# Patient Record
Sex: Female | Born: 1950 | Race: White | Hispanic: No | Marital: Single | Smoking: Former smoker
Health system: Southern US, Community
[De-identification: ages and names within clinical notes are randomized; demographics above are authoritative.]

## PROBLEM LIST (undated history)

## (undated) DIAGNOSIS — E039 Hypothyroidism, unspecified: Secondary | ICD-10-CM

## (undated) DIAGNOSIS — K219 Gastro-esophageal reflux disease without esophagitis: Secondary | ICD-10-CM

## (undated) DIAGNOSIS — K5792 Diverticulitis of intestine, part unspecified, without perforation or abscess without bleeding: Secondary | ICD-10-CM

## (undated) HISTORY — DX: Diverticulitis of intestine, part unspecified, without perforation or abscess without bleeding: K57.92

## (undated) HISTORY — DX: Gastro-esophageal reflux disease without esophagitis: K21.9

---

## 2009-02-01 ENCOUNTER — Inpatient Hospital Stay: Payer: Self-pay | Admitting: Internal Medicine

## 2009-04-04 ENCOUNTER — Ambulatory Visit: Payer: Self-pay | Admitting: Gastroenterology

## 2009-07-14 ENCOUNTER — Ambulatory Visit: Payer: Self-pay | Admitting: Surgery

## 2009-07-30 ENCOUNTER — Ambulatory Visit: Payer: Self-pay | Admitting: Surgery

## 2009-07-31 ENCOUNTER — Ambulatory Visit: Payer: Self-pay | Admitting: Surgery

## 2009-08-07 ENCOUNTER — Inpatient Hospital Stay: Payer: Self-pay | Admitting: Surgery

## 2009-09-15 ENCOUNTER — Ambulatory Visit: Payer: Self-pay | Admitting: Internal Medicine

## 2009-10-24 ENCOUNTER — Ambulatory Visit: Payer: Self-pay | Admitting: Internal Medicine

## 2010-10-26 ENCOUNTER — Ambulatory Visit: Payer: Self-pay | Admitting: Internal Medicine

## 2011-06-29 ENCOUNTER — Inpatient Hospital Stay: Payer: Self-pay | Admitting: Internal Medicine

## 2011-09-17 IMAGING — US ABDOMEN ULTRASOUND
1 series · 17 of 25 positions shown · non-contrast
Comparison: none

REASON FOR EXAM: abd pain   eval liver and gallbladder  status post
sigmoid colectomy
COMMENTS:

[Series 1: abdomen ultrasound · 17 of 54 slices shown]
[im 1/54]
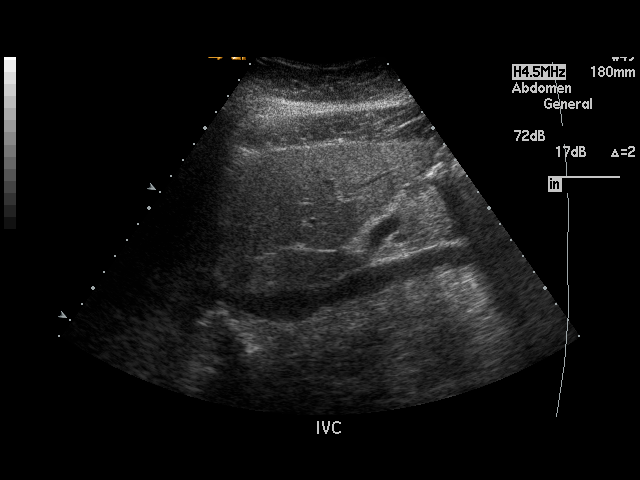
[im 5/54]
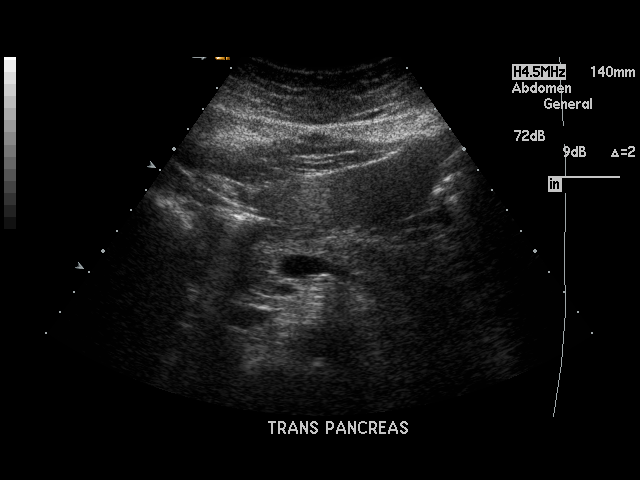
[im 7/54]
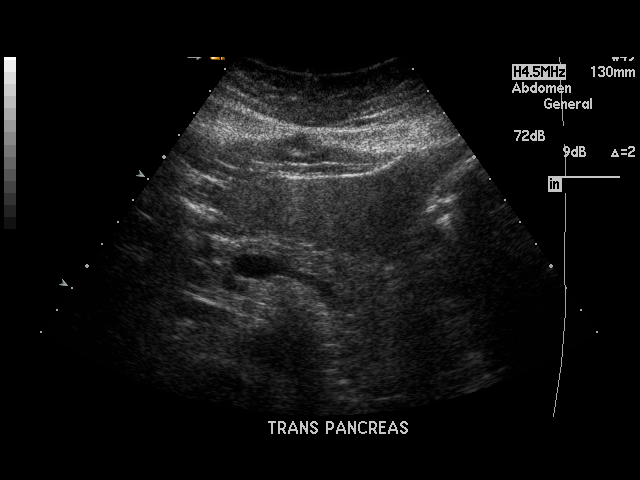
[im 12/54]
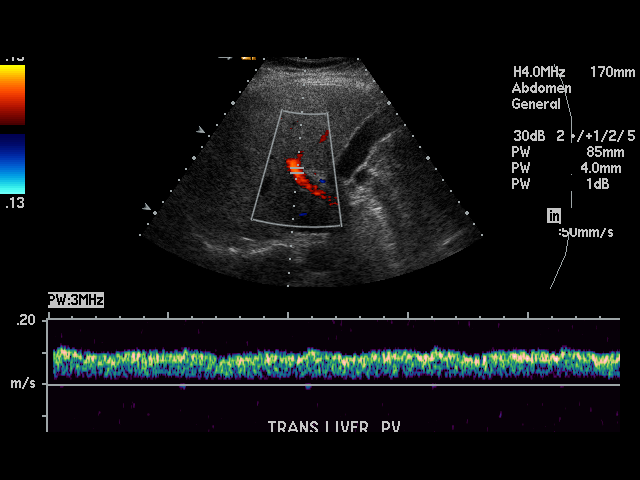
[im 14/54]
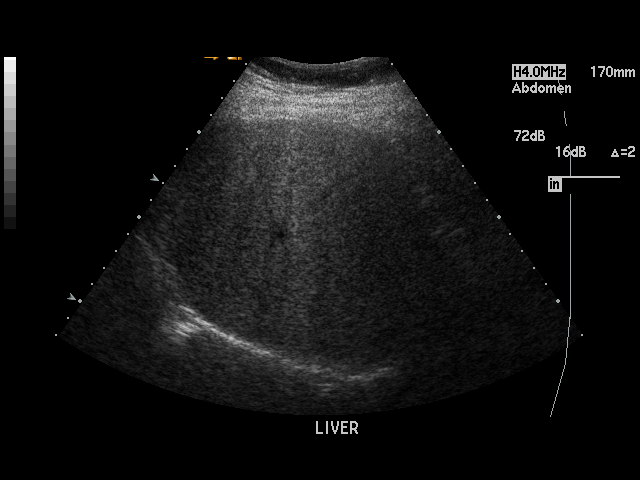
[im 18/54]
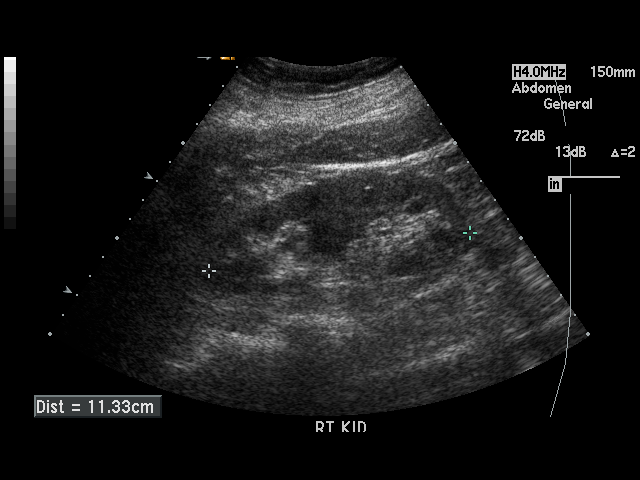
[im 20/54]
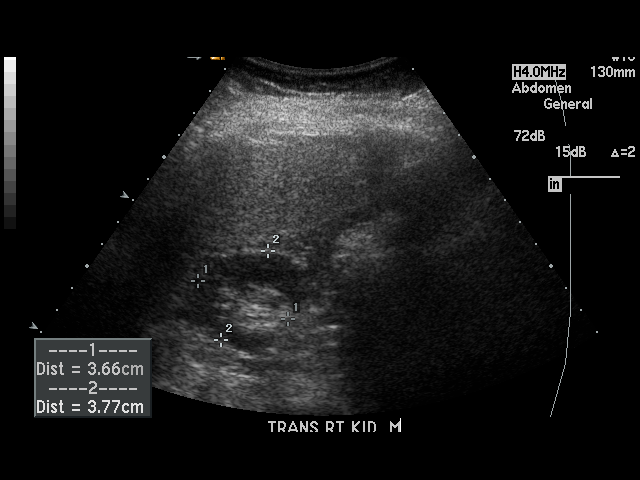
[im 25/54]
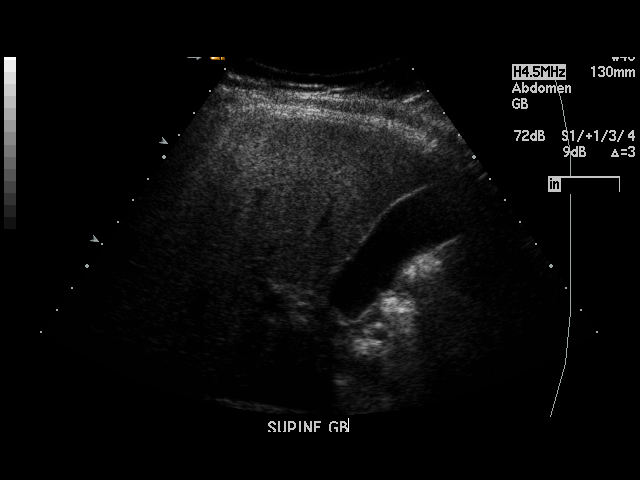
[im 27/54]
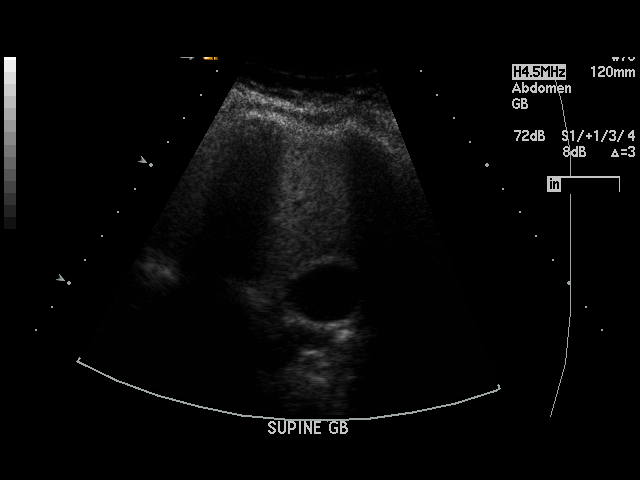
[im 29/54]
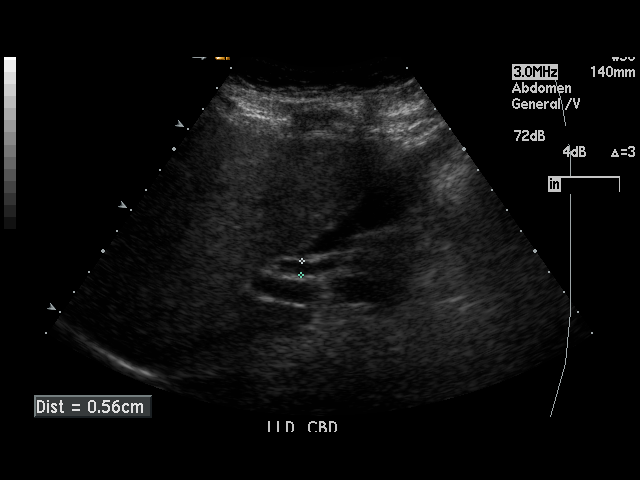
[im 34/54]
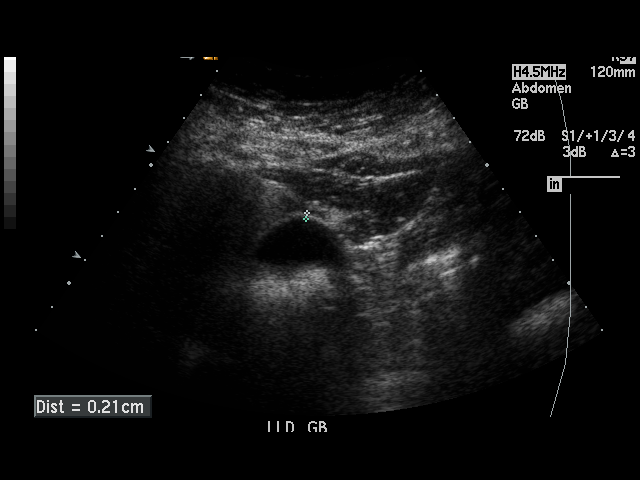
[im 36/54]
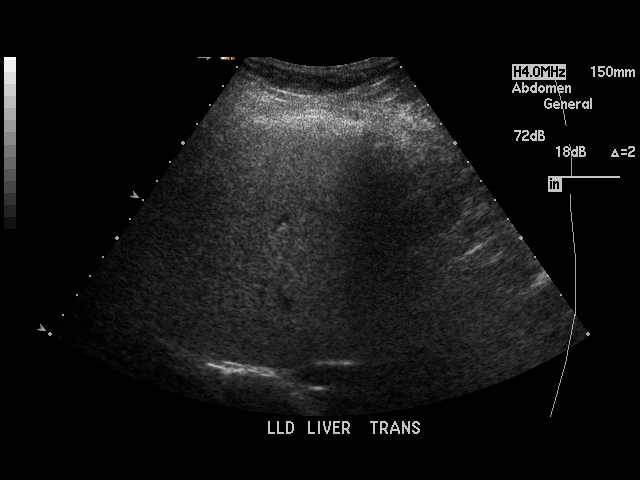
[im 40/54]
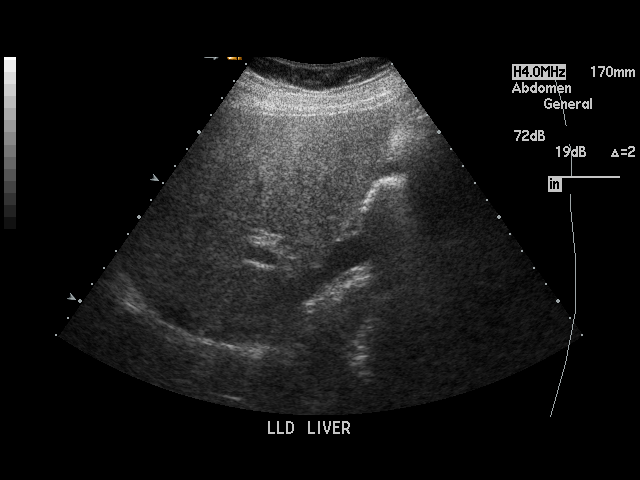
[im 42/54]
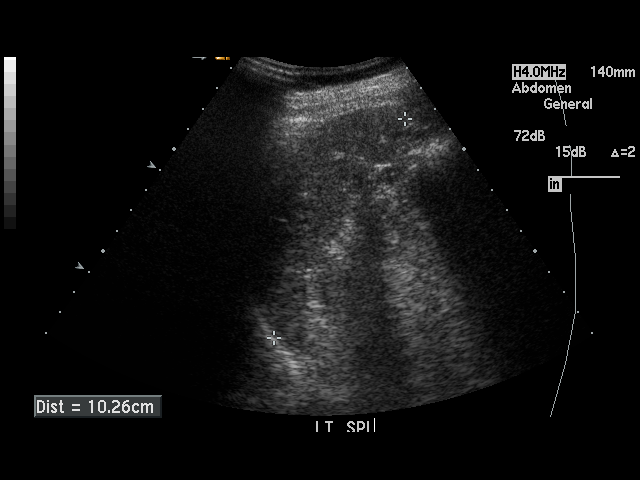
[im 47/54]
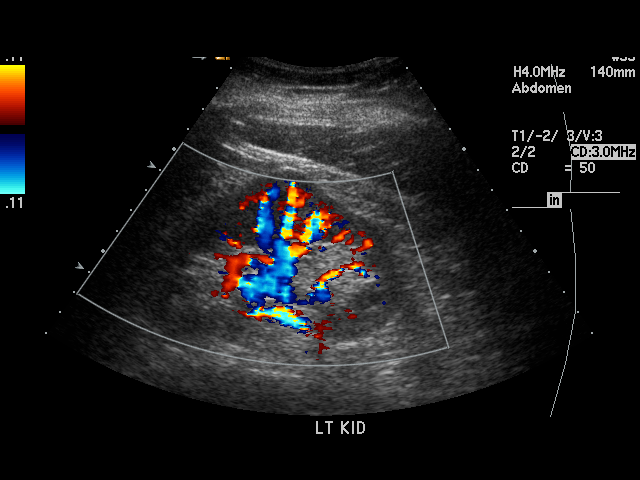
[im 49/54]
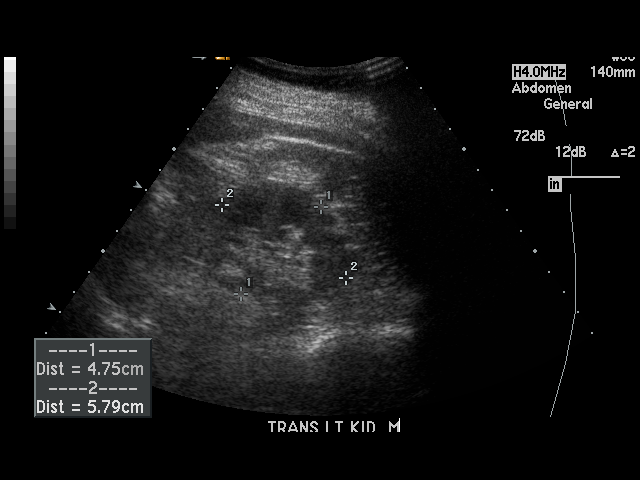
[im 54/54]
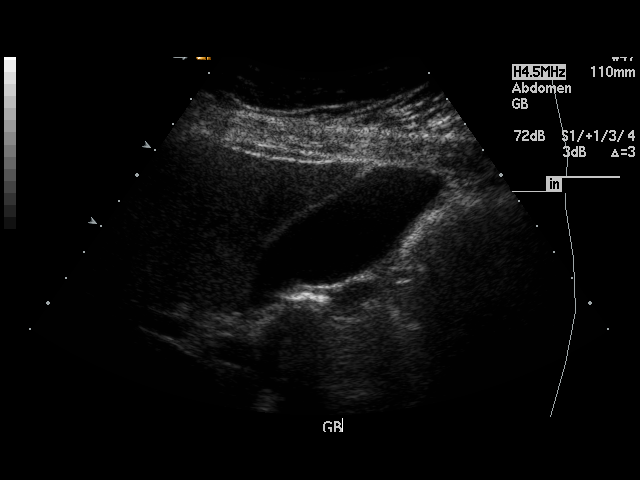

[17 of 25 positions shown; findings below may reference images not displayed]

PROCEDURE:     US  - US ABDOMEN GENERAL SURVEY  - September 15, 2009 [DATE]

RESULT:     The liver, spleen, pancreas, abdominal aorta and inferior vena
cava show no significant abnormalities.  No gallstones are seen. There is no
thickening of the gallbladder wall. The common bile duct measures 5.6 cm at
maximum diameter which is within normal limits. No renal mass lesions are
seen. There is mild prominence of the right renal pelvis, likely secondary
to an extrarenal pelvis. No dilatation of the renal calyces is seen.
IMPRESSION: 1. No acute changes are identified.
2. There is mild prominence of the right renal pelvis without associated
calyceal dilatation and likely secondary to an extrarenal pelvis.

## 2011-11-18 ENCOUNTER — Ambulatory Visit: Payer: Self-pay | Admitting: Internal Medicine

## 2018-06-29 ENCOUNTER — Other Ambulatory Visit: Payer: Self-pay

## 2018-06-29 ENCOUNTER — Emergency Department: Payer: Medicare Other

## 2018-06-29 ENCOUNTER — Emergency Department
Admission: EM | Admit: 2018-06-29 | Discharge: 2018-06-29 | Disposition: A | Discharge status: Home / Self Care | Payer: Medicare Other | Attending: Emergency Medicine | Admitting: Emergency Medicine

## 2018-06-29 DIAGNOSIS — Y93K1 Activity, walking an animal: Secondary | ICD-10-CM | POA: Insufficient documentation

## 2018-06-29 DIAGNOSIS — W010XXA Fall on same level from slipping, tripping and stumbling without subsequent striking against object, initial encounter: Secondary | ICD-10-CM | POA: Diagnosis not present

## 2018-06-29 DIAGNOSIS — W19XXXA Unspecified fall, initial encounter: Secondary | ICD-10-CM

## 2018-06-29 DIAGNOSIS — Y929 Unspecified place or not applicable: Secondary | ICD-10-CM | POA: Diagnosis not present

## 2018-06-29 DIAGNOSIS — S63511A Sprain of carpal joint of right wrist, initial encounter: Secondary | ICD-10-CM

## 2018-06-29 DIAGNOSIS — Y999 Unspecified external cause status: Secondary | ICD-10-CM | POA: Insufficient documentation

## 2018-06-29 DIAGNOSIS — Z79899 Other long term (current) drug therapy: Secondary | ICD-10-CM | POA: Insufficient documentation

## 2018-06-29 DIAGNOSIS — Y92009 Unspecified place in unspecified non-institutional (private) residence as the place of occurrence of the external cause: Secondary | ICD-10-CM

## 2018-06-29 DIAGNOSIS — S6991XA Unspecified injury of right wrist, hand and finger(s), initial encounter: Secondary | ICD-10-CM | POA: Diagnosis present

## 2018-06-29 MED ORDER — TRAMADOL HCL 50 MG PO TABS: 50.0000 mg | ORAL_TABLET | Freq: Two times a day (BID) | ORAL | 0 refills | Status: DC | PRN

## 2018-06-29 MED ORDER — TRAMADOL HCL 50 MG PO TABS
50.0000 mg | ORAL_TABLET | Freq: Once | ORAL | Status: AC
  Administered 2018-06-29: 50 mg via ORAL
  Filled 2018-06-29: qty 1

## 2018-06-29 MED ORDER — IBUPROFEN 400 MG PO TABS
400.0000 mg | ORAL_TABLET | Freq: Once | ORAL | Status: AC
  Administered 2018-06-29: 400 mg via ORAL
  Filled 2018-06-29: qty 1

## 2018-06-29 NOTE — ED Provider Notes (Signed)
[facility] Emergency Department Provider Note   ____________________________________________   First MD Initiated Contact with Patient 06/29/18 1052     (approximate)  I have reviewed the triage vital signs and the nursing notes.   HISTORY  Chief Complaint Fall    HPI Stacy Schmidt is a 67 y.o. female patient complain of right wrist pain secondary to trip and fall today while at her dog outside.  Patient broke a fall landing on the right wrist.  Patient states this pain edema but she noticed no deformity.  Patient denies loss of sensation or loss of function.  Patient did pain increased with dorsal flexion.  Patient rates pain as 8/10.  Patient described the pain is "achy".  No palliative measures prior to arrival.  No past medical history on file.  There are no active problems to display for this patient.     Prior to Admission medications   Medication Sig Start Date End Date Taking? Authorizing Provider  levothyroxine (SYNTHROID, LEVOTHROID) 112 MCG tablet Take 112 mcg by mouth daily before breakfast.   Yes [provider]  traMADol (ULTRAM) 50 MG tablet Take 1 tablet (50 mg total) by mouth every 12 (twelve) hours as needed. 06/29/18   Joni ReiningSmith, Ronald K, PA-C    Allergies Patient has no known allergies.  No family history on file.  Social History Social History   Tobacco Use  . Smoking status: Not on file  Substance Use Topics  . Alcohol use: Not on file  . Drug use: Not on file    Review of Systems Constitutional: No fever/chills Eyes: No visual changes. ENT: No sore throat. Cardiovascular: Denies chest pain. Respiratory: Denies shortness of breath. Gastrointestinal: No abdominal pain.  No nausea, no vomiting.  No diarrhea.  No constipation. Genitourinary: Negative for dysuria. Musculoskeletal: Negative for back pain. Skin: Negative for rash. Neurological: Negative for headaches, focal weakness or  numbness. Endocrine:Hypothyroidism  ____________________________________________   PHYSICAL EXAM:  VITAL SIGNS: ED Triage Vitals  Enc Vitals Group     BP 06/29/18 0933 (!) 116/91     Pulse Rate 06/29/18 0933 89     Resp 06/29/18 0933 18     Temp 06/29/18 0933 97.9 F (36.6 C)     Temp Source 06/29/18 0933 Oral     SpO2 06/29/18 0933 96 %     Weight --      Height --      Head Circumference --      Peak Flow --      Pain Score 06/29/18 0934 8     Pain Loc --      Pain Edu? --      Excl. in GC? --    Constitutional: Alert and oriented. Well appearing and in no acute distress. Cardiovascular: Normal rate, regular rhythm. Grossly normal heart sounds.  Good peripheral circulation. Respiratory: Normal respiratory effort.  No retractions. Lungs CTAB. Musculoskeletal: Obvious deformity to right wrist.  There is mild edema.  Patient has moderate guarding palpation at the scaphoid.  Decreased range of motion with dorsiflexion limited by complaint of pain. Neurologic:  Normal speech and language. No gross focal neurologic deficits are appreciated. No gait instability. Skin:  Skin is warm, dry and intact. No rash noted. Psychiatric: Mood and affect are normal. Speech and behavior are normal.  ____________________________________________   LABS (all labs ordered are listed, but only abnormal results are displayed)  Labs Reviewed - No data to display ____________________________________________  EKG  ____________________________________________  RADIOLOGY  ED MD interpretation:    Official radiology report(s): Dg Wrist Complete Right  Result Date: 06/29/2018 CLINICAL DATA:  Pt fell out the door while trying to let senior dog outside, pt states she fell forward onto right wrist; pain anterior right wrist more proximal EXAM: RIGHT WRIST - COMPLETE 3+ VIEW COMPARISON:  None. FINDINGS: No fracture.  No bone lesion. Scapholunate interval measures 3 mm, raising the possibility of  a scapholunate ligament disruption. Mild narrowing of the scaphoid trapezium articulation. Remaining joints normally spaced and aligned. Soft tissues are unremarkable. IMPRESSION: 1. No acute fracture or dislocation. 2. Questionable disruption of the scapholunate ligament reflected by borderline widening of the scapholunate interval, likely a chronic finding. 3. Mild scaphoid trapezium articulation osteoarthritis. Electronically Signed   By: Amie Portland M.D.   On: 06/29/2018 10:25    ____________________________________________   PROCEDURES  Procedure(s) performed: None  Procedures  Critical Care performed: No  ____________________________________________   INITIAL IMPRESSION / ASSESSMENT AND PLAN / ED COURSE  As part of my medical decision making, I reviewed the following data within the electronic MEDICAL RECORD NUMBER    Sprain right wrist.  Discussed x-ray findings with patient.  Patient placed in a wrist splint and advised follow-up with orthopedics.     ____________________________________________   FINAL CLINICAL IMPRESSION(S) / ED DIAGNOSES  Final diagnoses:  Sprain of carpal joint of right wrist, initial encounter  Fall in home, initial encounter     ED Discharge Orders         Ordered    traMADol (ULTRAM) 50 MG tablet  Every 12 hours PRN     06/29/18 1103           Note:  This document was prepared using Dragon voice recognition software and may include unintentional dictation errors.    Joni Reining, PA-C 06/29/18 1109    Arnaldo Natal, MD 07/01/18 [phone]

## 2018-06-29 NOTE — ED Triage Notes (Signed)
Pt fell out the door while trying to let senior dog outside, pt states she fell forward onto right wrist, pt also hit head on railing, but denies pain to head.

## 2018-06-29 NOTE — Discharge Instructions (Addendum)
Wear splint while awake for 5 to 7 days as needed.  Advised ibuprofen 400 mg every 8 hours as needed for swelling.

## 2018-06-29 NOTE — ED Notes (Signed)
See triage note  Presents s/p fall  States she fell out of her front door  Hit her head,right side of chest and wrist    Denies any head pain at present but is having pain to wrist and chest  Good pulses noted

## 2018-06-29 NOTE — ED Notes (Signed)
FIRST NURSE NOTE:  Pt had fall last night into wooden railing, fell onto right wrist, pain and swelling noted.

## 2020-06-04 ENCOUNTER — Other Ambulatory Visit: Payer: Self-pay

## 2020-06-04 DIAGNOSIS — N179 Acute kidney failure, unspecified: Principal | ICD-10-CM | POA: Diagnosis present

## 2020-06-04 DIAGNOSIS — E872 Acidosis: Secondary | ICD-10-CM | POA: Diagnosis present

## 2020-06-04 DIAGNOSIS — F17210 Nicotine dependence, cigarettes, uncomplicated: Secondary | ICD-10-CM | POA: Diagnosis present

## 2020-06-04 DIAGNOSIS — E860 Dehydration: Secondary | ICD-10-CM | POA: Diagnosis present

## 2020-06-04 DIAGNOSIS — E039 Hypothyroidism, unspecified: Secondary | ICD-10-CM | POA: Diagnosis present

## 2020-06-04 DIAGNOSIS — Z20822 Contact with and (suspected) exposure to covid-19: Secondary | ICD-10-CM | POA: Diagnosis present

## 2020-06-04 DIAGNOSIS — R55 Syncope and collapse: Secondary | ICD-10-CM | POA: Diagnosis not present

## 2020-06-04 DIAGNOSIS — Z66 Do not resuscitate: Secondary | ICD-10-CM | POA: Diagnosis not present

## 2020-06-04 DIAGNOSIS — Z79890 Hormone replacement therapy (postmenopausal): Secondary | ICD-10-CM

## 2020-06-04 DIAGNOSIS — K529 Noninfective gastroenteritis and colitis, unspecified: Secondary | ICD-10-CM | POA: Diagnosis present

## 2020-06-04 DIAGNOSIS — M6282 Rhabdomyolysis: Secondary | ICD-10-CM | POA: Diagnosis present

## 2020-06-04 LAB — CBC
HCT: 48.7 % — ABNORMAL HIGH (ref 36.0–46.0)
Hemoglobin: 17.1 g/dL — ABNORMAL HIGH (ref 12.0–15.0)
MCH: 30.1 pg (ref 26.0–34.0)
MCHC: 35.1 g/dL (ref 30.0–36.0)
MCV: 85.7 fL (ref 80.0–100.0)
Platelets: 395 10*3/uL (ref 150–400)
RBC: 5.68 MIL/uL — ABNORMAL HIGH (ref 3.87–5.11)
RDW: 12.3 % (ref 11.5–15.5)
WBC: 11.9 10*3/uL — ABNORMAL HIGH (ref 4.0–10.5)
nRBC: 0 % (ref 0.0–0.2)

## 2020-06-04 LAB — COMPREHENSIVE METABOLIC PANEL
ALT: 29 U/L (ref 0–44)
AST: 46 U/L — ABNORMAL HIGH (ref 15–41)
Albumin: 5.3 g/dL — ABNORMAL HIGH (ref 3.5–5.0)
Alkaline Phosphatase: 106 U/L (ref 38–126)
Anion gap: 25 — ABNORMAL HIGH (ref 5–15)
BUN: 68 mg/dL — ABNORMAL HIGH (ref 8–23)
CO2: 23 mmol/L (ref 22–32)
Calcium: 9.1 mg/dL (ref 8.9–10.3)
Chloride: 87 mmol/L — ABNORMAL LOW (ref 98–111)
Creatinine, Ser: 4.7 mg/dL — ABNORMAL HIGH (ref 0.44–1.00)
GFR calc Af Amer: 10 mL/min — ABNORMAL LOW (ref >60)
GFR calc non Af Amer: 9 mL/min — ABNORMAL LOW (ref >60)
Glucose, Bld: 121 mg/dL — ABNORMAL HIGH (ref 70–99)
Potassium: 4.4 mmol/L (ref 3.5–5.1)
Sodium: 135 mmol/L (ref 135–145)
Total Bilirubin: 1 mg/dL (ref 0.3–1.2)
Total Protein: 9 g/dL — ABNORMAL HIGH (ref 6.5–8.1)

## 2020-06-04 LAB — TROPONIN I (HIGH SENSITIVITY)
Troponin I (High Sensitivity): 23 ng/L — ABNORMAL HIGH (ref <18)
Troponin I (High Sensitivity): 22 ng/L — ABNORMAL HIGH (ref <18)

## 2020-06-04 LAB — LIPASE, BLOOD: Lipase: 20 U/L (ref 11–51)

## 2020-06-04 NOTE — ED Triage Notes (Signed)
Pt in with co body aches, vomiting and episodes of syncope for 3 days. Denies any diarrhea, also co generalized abd pain. Denies any dysuria or fever. Pt states she has also had leg cramps.

## 2020-06-05 ENCOUNTER — Emergency Department: Payer: No Typology Code available for payment source

## 2020-06-05 ENCOUNTER — Encounter: Payer: Self-pay | Admitting: Internal Medicine

## 2020-06-05 ENCOUNTER — Inpatient Hospital Stay
Admission: EM | Admit: 2020-06-05 | Discharge: 2020-06-07 | DRG: 683 | Disposition: A | Discharge status: Home / Self Care | Payer: No Typology Code available for payment source | Attending: Internal Medicine | Admitting: Internal Medicine

## 2020-06-05 DIAGNOSIS — N179 Acute kidney failure, unspecified: Secondary | ICD-10-CM | POA: Diagnosis present

## 2020-06-05 DIAGNOSIS — M6282 Rhabdomyolysis: Secondary | ICD-10-CM | POA: Diagnosis present

## 2020-06-05 DIAGNOSIS — E872 Acidosis: Secondary | ICD-10-CM | POA: Diagnosis present

## 2020-06-05 DIAGNOSIS — F17210 Nicotine dependence, cigarettes, uncomplicated: Secondary | ICD-10-CM | POA: Diagnosis present

## 2020-06-05 DIAGNOSIS — R55 Syncope and collapse: Secondary | ICD-10-CM | POA: Diagnosis present

## 2020-06-05 DIAGNOSIS — Z20822 Contact with and (suspected) exposure to covid-19: Secondary | ICD-10-CM | POA: Diagnosis present

## 2020-06-05 DIAGNOSIS — E860 Dehydration: Secondary | ICD-10-CM

## 2020-06-05 DIAGNOSIS — Z66 Do not resuscitate: Secondary | ICD-10-CM | POA: Diagnosis not present

## 2020-06-05 DIAGNOSIS — Z79890 Hormone replacement therapy (postmenopausal): Secondary | ICD-10-CM | POA: Diagnosis not present

## 2020-06-05 DIAGNOSIS — F17200 Nicotine dependence, unspecified, uncomplicated: Secondary | ICD-10-CM | POA: Diagnosis present

## 2020-06-05 DIAGNOSIS — F17219 Nicotine dependence, cigarettes, with unspecified nicotine-induced disorders: Secondary | ICD-10-CM

## 2020-06-05 DIAGNOSIS — K529 Noninfective gastroenteritis and colitis, unspecified: Secondary | ICD-10-CM | POA: Diagnosis present

## 2020-06-05 DIAGNOSIS — E039 Hypothyroidism, unspecified: Secondary | ICD-10-CM | POA: Diagnosis present

## 2020-06-05 HISTORY — DX: Hypothyroidism, unspecified: E03.9

## 2020-06-05 LAB — URINALYSIS, COMPLETE (UACMP) WITH MICROSCOPIC
Glucose, UA: NEGATIVE mg/dL
Ketones, ur: NEGATIVE mg/dL
Leukocytes,Ua: NEGATIVE
Nitrite: NEGATIVE
Protein, ur: 30 mg/dL — AB
Specific Gravity, Urine: 1.018 (ref 1.005–1.030)
pH: 5 (ref 5.0–8.0)

## 2020-06-05 LAB — HIV ANTIBODY (ROUTINE TESTING W REFLEX): HIV Screen 4th Generation wRfx: NONREACTIVE

## 2020-06-05 LAB — CK: Total CK: 1139 U/L — ABNORMAL HIGH (ref 38–234)

## 2020-06-05 LAB — SARS CORONAVIRUS 2 BY RT PCR (HOSPITAL ORDER, PERFORMED IN ~~LOC~~ HOSPITAL LAB): SARS Coronavirus 2: NEGATIVE

## 2020-06-05 LAB — LACTIC ACID, PLASMA: Lactic Acid, Venous: 2 mmol/L (ref 0.5–1.9)

## 2020-06-05 MED ORDER — OMEGA-3-ACID ETHYL ESTERS 1 G PO CAPS
2.0000 g | ORAL_CAPSULE | Freq: Every day | ORAL | Status: DC
  Administered 2020-06-06 – 2020-06-07 (×2): 2 g via ORAL
  Filled 2020-06-05 (×3): qty 2

## 2020-06-05 MED ORDER — MORPHINE SULFATE (PF) 2 MG/ML IV SOLN
2.0000 mg | Freq: Once | INTRAVENOUS | Status: AC
  Administered 2020-06-05: 2 mg via INTRAVENOUS
  Filled 2020-06-05: qty 1

## 2020-06-05 MED ORDER — ONDANSETRON HCL 4 MG/2ML IJ SOLN
4.0000 mg | Freq: Four times a day (QID) | INTRAMUSCULAR | Status: DC | PRN
  Administered 2020-06-05 – 2020-06-06 (×2): 4 mg via INTRAVENOUS
  Filled 2020-06-05 (×2): qty 2

## 2020-06-05 MED ORDER — LEVOTHYROXINE SODIUM 112 MCG PO TABS
112.0000 ug | ORAL_TABLET | Freq: Every day | ORAL | Status: DC
  Filled 2020-06-05 (×2): qty 1

## 2020-06-05 MED ORDER — ONDANSETRON HCL 4 MG PO TABS: 4.0000 mg | ORAL_TABLET | Freq: Four times a day (QID) | ORAL | Status: DC | PRN

## 2020-06-05 MED ORDER — SODIUM CHLORIDE 0.9 % IV SOLN: INTRAVENOUS | Status: DC

## 2020-06-05 MED ORDER — PANTOPRAZOLE SODIUM 40 MG PO TBEC: 40.0000 mg | DELAYED_RELEASE_TABLET | Freq: Every day | ORAL | Status: DC

## 2020-06-05 MED ORDER — VITAMIN B-12 1000 MCG PO TABS
1000.0000 ug | ORAL_TABLET | Freq: Every day | ORAL | Status: DC
  Administered 2020-06-06 – 2020-06-07 (×2): 1000 ug via ORAL
  Filled 2020-06-05 (×3): qty 1

## 2020-06-05 MED ORDER — ACETAMINOPHEN 650 MG RE SUPP: 650.0000 mg | Freq: Four times a day (QID) | RECTAL | Status: DC | PRN

## 2020-06-05 MED ORDER — ONDANSETRON HCL 4 MG/2ML IJ SOLN
4.0000 mg | Freq: Once | INTRAMUSCULAR | Status: AC
  Administered 2020-06-05: 4 mg via INTRAVENOUS
  Filled 2020-06-05: qty 2

## 2020-06-05 MED ORDER — HEPARIN SODIUM (PORCINE) 5000 UNIT/ML IJ SOLN
5000.0000 [IU] | Freq: Three times a day (TID) | INTRAMUSCULAR | Status: DC
  Administered 2020-06-05 – 2020-06-06 (×3): 5000 [IU] via SUBCUTANEOUS
  Filled 2020-06-05 (×5): qty 1

## 2020-06-05 MED ORDER — SODIUM CHLORIDE 0.9 % IV BOLUS
1000.0000 mL | Freq: Once | INTRAVENOUS | Status: AC
  Administered 2020-06-05: 1000 mL via INTRAVENOUS

## 2020-06-05 MED ORDER — ENOXAPARIN SODIUM 40 MG/0.4ML ~~LOC~~ SOLN: 40.0000 mg | SUBCUTANEOUS | Status: DC

## 2020-06-05 MED ORDER — ACETAMINOPHEN 325 MG PO TABS: 650.0000 mg | ORAL_TABLET | Freq: Four times a day (QID) | ORAL | Status: DC | PRN

## 2020-06-05 NOTE — ED Provider Notes (Signed)
[facility] Emergency Department Provider Note   ____________________________________________   First MD Initiated Contact with Patient 06/05/20 1019     (approximate)  I have reviewed the triage vital signs and the nursing notes.   HISTORY  Chief Complaint Loss of Consciousness and Emesis    HPI Stacy Schmidt is a 69 y.o. female history of hypothyroidism   Patient presents for evaluation for nausea vomiting abdominal pain  Patient reports 3 to 4 days ago she went out to lunch, started having pain in her lower abdomen.  Felt like she needed to have a bowel movement, but then proceeded began having vomiting.  No fevers or chills.  No chest pain.  No shortness of breath.  She does report that she is having tenderness in her mid to lower abdomen.  She has not been able to eat or drink anything for a few days.  She feels slightly nauseated.  None she initially was vomiting up very dark thick emesis.  She reported smelled like mosquito candles.  She had a very small bowel movement perhaps a day or 2 ago, but is not passing any gas.  She reports she still feels very constipated.  Has had a previous colon surgery about 10 years ago due to diverticulitis  No past medical history on file.  There are no problems to display for this patient.     Prior to Admission medications   Medication Sig Start Date End Date Taking? Authorizing Provider  Cyanocobalamin 1000 MCG TBCR Take 1 tablet by mouth daily.    [provider]  levothyroxine (SYNTHROID, LEVOTHROID) 112 MCG tablet Take 112 mcg by mouth daily before breakfast.    [provider]  traMADol (ULTRAM) 50 MG tablet Take 1 tablet (50 mg total) by mouth every 12 (twelve) hours as needed. 06/29/18   Joni Reining, PA-C    Allergies Patient has no known allergies.  No family history on file.  Social History Social History   Tobacco Use   Smoking status: Not on file  Substance  Use Topics   Alcohol use: Not on file   Drug use: Not on file   No drug use, no smoking  Review of Systems Constitutional: No fever/chills but feels very dehydrated Eyes: No visual changes. ENT: No sore throat. Cardiovascular: Denies chest pain. Respiratory: Denies shortness of breath. Gastrointestinal: See HPI  genitourinary: Negative for dysuria.  Has noticed she is not urinating hardly at all the last 3 days. Musculoskeletal: Negative for back pain. Skin: Negative for rash. Neurological: Negative for headaches, areas of focal weakness or numbness.   Patient reports she usually follows with the [facility] ____________________________________________   PHYSICAL EXAM:  VITAL SIGNS: ED Triage Vitals  Enc Vitals Group     BP 06/04/20 1837 (!) 125/58     Pulse Rate 06/04/20 1837 94     Resp 06/04/20 1837 18     Temp 06/04/20 1837 98.5 F (36.9 C)     Temp Source 06/04/20 1837 Oral     SpO2 06/04/20 1837 96 %     Weight 06/04/20 1837 140 lb (63.5 kg)     Height 06/04/20 1837 5\' 5"  (1.651 m)     Head Circumference --      Peak Flow --      Pain Score 06/04/20 1859 8     Pain Loc --      Pain Edu? --      Excl. in GC? --  Constitutional: Alert and oriented. Well appearing and in no acute distress. Eyes: Conjunctivae are normal. Head: Atraumatic. Nose: No congestion/rhinnorhea. Mouth/Throat: Mucous membranes are dry. Neck: No stridor.  Cardiovascular: Normal rate, regular rhythm. Grossly normal heart sounds.  Good peripheral circulation. Respiratory: Normal respiratory effort.  No retractions. Lungs CTAB. Gastrointestinal: Soft and mild tenderness of the right mid abdomen without rebound or guarding. No distention. Musculoskeletal: No lower extremity tenderness nor edema. Neurologic:  Normal speech and language. No gross focal neurologic deficits are appreciated.  Skin:  Skin is warm, dry and intact. No rash noted. Psychiatric: Mood and affect are normal.  Speech and behavior are normal.  ____________________________________________   LABS (all labs ordered are listed, but only abnormal results are displayed)  Labs Reviewed  CBC - Abnormal; Notable for the following components:      Result Value   WBC 11.9 (*)    RBC 5.68 (*)    Hemoglobin 17.1 (*)    HCT 48.7 (*)    All other components within normal limits  COMPREHENSIVE METABOLIC PANEL - Abnormal; Notable for the following components:   Chloride 87 (*)    Glucose, Bld 121 (*)    BUN 68 (*)    Creatinine, Ser 4.70 (*)    Total Protein 9.0 (*)    Albumin 5.3 (*)    AST 46 (*)    GFR calc non Af Amer 9 (*)    GFR calc Af Amer 10 (*)    Anion gap 25 (*)    All other components within normal limits  URINALYSIS, COMPLETE (UACMP) WITH MICROSCOPIC - Abnormal; Notable for the following components:   Color, Urine AMBER (*)    APPearance CLOUDY (*)    Hgb urine dipstick SMALL (*)    Bilirubin Urine MODERATE (*)    Protein, ur 30 (*)    Bacteria, UA RARE (*)    All other components within normal limits  LACTIC ACID, PLASMA - Abnormal; Notable for the following components:   Lactic Acid, Venous 2.0 (*)    All other components within normal limits  TROPONIN I (HIGH SENSITIVITY) - Abnormal; Notable for the following components:   Troponin I (High Sensitivity) 23 (*)    All other components within normal limits  TROPONIN I (HIGH SENSITIVITY) - Abnormal; Notable for the following components:   Troponin I (High Sensitivity) 22 (*)    All other components within normal limits  SARS CORONAVIRUS 2 BY RT PCR (HOSPITAL ORDER, PERFORMED IN [facility])  LIPASE, BLOOD  CK   ____________________________________________  EKG  Reviewed interpreted at 1840 Heart rate 99 QRS 60 QTc 450 Normal sinus rhythm, no evidence of acute ischemia. ____________________________________________  RADIOLOGY  CT Head Wo Contrast  Result Date: 06/05/2020 CLINICAL DATA:  Body aches,  emesis, syncopal episodes for 3 days EXAM: CT HEAD WITHOUT CONTRAST TECHNIQUE: Contiguous axial images were obtained from the base of the skull through the vertex without intravenous contrast. COMPARISON:  None. FINDINGS: Brain: No evidence of acute infarction, hemorrhage, hydrocephalus, extra-axial collection or mass lesion/mass effect. Symmetric prominence of the ventricles, cisterns and sulci compatible with parenchymal volume loss. Patchy areas of white matter hypoattenuation are most compatible with chronic microvascular angiopathy. Vascular: Atherosclerotic calcification of the carotid siphons and intradural vertebral arteries. No hyperdense vessel. Skull: No calvarial fracture or suspicious osseous lesion. No scalp swelling or hematoma. Sinuses/Orbits: Paranasal sinuses and mastoid air cells are predominantly clear. Included orbital structures are unremarkable. Other: Mild bilateral TMJ arthrosis. IMPRESSION:  1. No acute intracranial findings. 2. Chronic microvascular angiopathy and parenchymal volume loss. Electronically Signed   By: Kreg Shropshire M.D.   On: 06/05/2020 04:28   CT Renal Stone Study  Result Date: 06/05/2020 CLINICAL DATA:  Body aches, vomiting and episodes of syncope for 3 days, denies dysuria or fever EXAM: CT ABDOMEN AND PELVIS WITHOUT CONTRAST TECHNIQUE: Multidetector CT imaging of the abdomen and pelvis was performed following the standard protocol without IV contrast. COMPARISON:  CT 06/29/2011 FINDINGS: Lower chest: Extensive respiratory motion artifact in the lung bases. Some coarsened interstitial changes and reticular areas of scarring are similar to comparison from 20/12. Lung bases are otherwise clear. Small fat containing right Bochdalek's hernia. Normal heart size. No pericardial effusion. Few calcifications on the aortic leaflets and mitral annulus. Coronary artery calcifications as well. Minimal pericardial thickening anteriorly. Hepatobiliary: No visible concerning liver  lesions. Normal liver attenuation. Smooth liver surface contour. Gradient attenuation within the gallbladder, could reflect biliary sludge. No pericholecystic fluid or inflammation. No frank biliary ductal dilatation or visible intraductal gallstones. Pancreas: Unremarkable. No pancreatic ductal dilatation or surrounding inflammatory changes. Spleen: Normal in size. No concerning splenic lesions. Adrenals/Urinary Tract: Normal adrenal glands. Kidneys are symmetric in size and normally located. Few areas of cortical scarring noted bilaterally. No visible concerning renal lesions. No urolithiasis or hydronephrosis. Suspect a small vascular calcifications seen in the upper pole right kidney. Urinary bladder is largely decompressed at the time of exam and therefore poorly evaluated by CT imaging. No gross bladder abnormality accounting for underdistention. Stomach/Bowel: Distal esophagus is unremarkable. Some mild gastric thickening likely related to underdistention. Duodenum crosses the midline abdomen normally. Multiple air and fluid-filled loops of small bowel with mural thickening including several loops which are borderline distended up to 3.2 cm. Transitions to the dilated segment seen in the midline abdomen (2/51) and right upper quadrant (2/38). Some associated hazy mesenteric stranding is noted as well. There is intramural fat within the cecum and ascending colon. Some mild edematous thickening of the colonic wall is also noted. Few noninflamed colonic diverticula. Colorectal anastomosis in the low midline pelvis (2/73) appears patent. Vascular/Lymphatic: Atherosclerotic calcifications within the abdominal aorta and branch vessels. No aneurysm or ectasia. No enlarged abdominopelvic lymph nodes. Some reactive mesenteric nodes are present with hazy mesenteric stranding. Reproductive: Uterus appears surgically absent. No concerning adnexal lesions. Other: No abdominopelvic free fluid or free gas. No bowel  containing hernias. Small fat containing left femoral hernia (2/69). Musculoskeletal: The osseous structures appear diffusely demineralized which may limit detection of small or nondisplaced fractures. No acute osseous abnormality or suspicious osseous lesion. Multilevel degenerative changes are present in the imaged portions of the spine. Features most pronounced L5-S1. Additional mild degenerative changes in the hips and pelvis. IMPRESSION: 1. Multiple air and fluid-filled loops of small bowel with mild mural thickening. Additional mild edematous mural thickening of the colon as well. Features are concerning for an acute infectious or inflammatory enterocolitis. 2. Segmental thickening of several loops in the right hemiabdomen with transition points in the upper midline abdomen and right upper quadrant. Could reflect a focal ileus or partial obstruction. 3. Intramural fat of the cecum and ascending colon could reflect sequela of prior inflammatory change. 4. Gradient attenuation within the gallbladder, could reflect biliary sludge. No pericholecystic fluid or inflammation. Correlate with abdominal symptoms and if there is further clinical concern, right upper quadrant ultrasound could be obtained. 5. Aortic Atherosclerosis (ICD10-I70.0). Electronically Signed   By: Coralie Keens.D.  On: 06/05/2020 04:37  Brief summary of findings CT scan notable for multiple air-fluid loops mild mural thickening.  Right upper abdomen possible transition points.  CT scan report by radiologist for additional details ____________________________________________   PROCEDURES  Procedure(s) performed: None  Procedures  Critical Care performed: No  ____________________________________________   INITIAL IMPRESSION / ASSESSMENT AND PLAN / ED COURSE  Pertinent labs & imaging results that were available during my care of the patient were reviewed by me and considered in my medical decision making (see chart for  details).   Differential diagnosis includes but is not limited to, abdominal perforation, aortic dissection, cholecystitis, appendicitis, diverticulitis, colitis, esophagitis/gastritis, kidney stone, pyelonephritis, urinary tract infection, aortic aneurysm. All are considered in decision and treatment plan. Based upon the patient's presentation and risk factors, I am concerned about potential obstructive, ileus, or infectious/inflammatory intra-abdominal pathology.  Imaging reviewed, concerning for possible ileus or obstructive-like changes as well as multiple air-fluid loops and possible enteritis or enterocolitis.  I have placed consult to general surgery at 10:55 AM, patient denies any chest pain.  No cardiac or pulmonary symptoms.  Covid test pending.  Defer to general surgery consult for treatment recommendations regarding abdominal findings of possible obstructive pathology or ileus.  She is not actively vomiting.  She does however report a constipated feeling in only a small amount of stool production and not passing gas.  Previous colonic surgery per her report years ago  Additionally, [facility]  Discussed with her and we will attempt transfer to [facility] in [place] if they have services available for her.  Patient understanding agreeable with this.  If [place] [facility] [facility] unable to accommodate, would anticipate admission here.   Clinical Course as of Jun 06 1355  Thu Jun 05, 2020  1105 Consult placed with Dr. Maia Planintron (discused case with him)   [MQ]  1106 Creatinine(!): 4.70 [MQ]  1106 Anion gap(!): 25 [MQ]  1106 GFR, Est Non African American(!): 9 [MQ]  1106 WBC(!): 11.9 [MQ]  1106 [facility]   [MQ]  1121 [facility] reports they are    [MQ]  1316 Patient resting comfortably at this time.  She feels like her pain has been relieved.  She is in no distress, I spoke with the [facility] and spoke with Dr. Felipa FurnaceGarcia at  [place], he advises and we will coordinate transfer request via transfer packet, but patient is not yet accepted.  He did however confirm that they have availability of surgical and nephrologic services to provide care for this patient   [MQ]    Clinical Course User Index [MQ] Sharyn CreamerQuale, Hilbert Briggs, MD    ----------------------------------------- 1:57 PM on 06/05/2020 -----------------------------------------  Admission discussed with Dr. Joylene IgoAgbata.  Also discussed with Dr. Maia Planintron and surgery will consult on patient today for recommendations  Discussed with [facility] Felip[facility]arcia at [facility] [place], he advises given her clinical history and their limited services he would recommend that she not transfer.  Patient will thus be [facility] not able to accommodate patient's needs at this time ____________________________________________   FINAL CLINICAL IMPRESSION(S) / ED DIAGNOSES  Final diagnoses:  Dehydration  AKI (acute kidney injury) (HCC)  Nausea vomiting, possible ileus      Note:  This document was prepared using Dragon voice recognition software and may include unintentional dictation errors       Sharyn CreamerQuale, Ferman Basilio, MD 06/05/20 1357

## 2020-06-05 NOTE — ED Notes (Signed)
Pt ambulatory to bathroom with one assist.

## 2020-06-05 NOTE — ED Notes (Signed)
General Surgery to bedside.

## 2020-06-05 NOTE — H&P (Addendum)
History and Physical    Stacy Schmidt YBW:[id number] DOB: May 26, 1951 DOA: 06/05/2020  PCP: [facility]   Patient coming from: Home  I have personally briefly reviewed patient's old medical records in [facility]  Chief Complaint: Nausea/vomiting                               Syncope  HPI: Stacy Schmidt is a 69 y.o. female with medical history significant for nicotine dependence and hypothyroidism who presents to the emergency room for evaluation of several days of nausea, vomiting and abdominal discomfort.  Patient states that she had eaten a cheeseburger from [facility] about 4 days ago and not too long after developed periumbilical discomfort associated with multiple episodes of nausea and vomiting.  She states that she has not been able to keep any food or liquid down for about 4 days but denies having any changes in her bowel habits.  She is unable to tell me when she had her last bowel movement.  Patient states that she woke up multiple times on her bathroom floor and thinks she may have passed out.  She did have periumbilical discomfort but that has resolved.  She has not voided in about 12 hours.  She decided to come to the emergency room to get checked out because her symptoms were not improving. She denies having any chest pain, shortness of breath, headache, fever, chills, urinary frequency, dysuria or nocturia. Labs show sodium of 135, potassium 4.4, chloride of 87, bicarb of 23, BUN of 68, creatinine of 4.70 compared to baseline of 0.7 in 2016, lactic acid of 2.0, white count of 11.9, hemoglobin of 17.1, platelet count of 395. CT scan of abdomen and pelvis shows multiple air and fluid-filled loops of small bowel with mild mural thickening.  Additional mildly edematous mural thickening of the colon.  Features are concerning for an acute infectious or inflammatory enterocolitis. Segmental thickening of several loops in the right hemiabdomen with transition point in the  upper midline abdomen and right upper quadrant.  Could reflect a focal ileus or partial obstruction. Twelve-lead EKG reviewed by me shows normal sinus rhythm  ED Course:   Review of Systems: As per HPI otherwise 10 point review of systems negative.    No past medical history on file.    has no history on file for tobacco use, alcohol use, and drug use.  No Known Allergies  Family History  Problem Relation Age of Onset  . Hypertension Mother   . Hypertension Father      Prior to Admission medications   Medication Sig Start Date End Date Taking? Authorizing Provider  Cyanocobalamin 1000 MCG TBCR Take 1 tablet by mouth daily.   Yes [provider]  fish oil-omega-3 fatty acids 1000 MG capsule Take 2 g by mouth daily.   Yes [provider]  levothyroxine (SYNTHROID, LEVOTHROID) 112 MCG tablet Take 112 mcg by mouth daily before breakfast.   Yes [provider]  omeprazole (PRILOSEC) 40 MG capsule Take 40 mg by mouth daily.   Yes [provider]    Physical Exam: Vitals:   06/04/20 2204 06/05/20 0058 06/05/20 0500 06/05/20 0838  BP: (!) 100/57 129/79 116/70 90/78  Pulse: 95 98 92 (!) 102  Resp: 16 17 20 18   Temp: 98.2 F (36.8 C) 97.8 F (36.6 C) 98 F (36.7 C) 98.1 F (36.7 C)  TempSrc:   Oral Oral  SpO2: 95% 97% 98% 94%  Weight:      Height:         Vitals:   06/04/20 2204 06/05/20 0058 06/05/20 0500 06/05/20 0838  BP: (!) 100/57 129/79 116/70 90/78  Pulse: 95 98 92 (!) 102  Resp: 16 17 20 18   Temp: 98.2 F (36.8 C) 97.8 F (36.6 C) 98 F (36.7 C) 98.1 F (36.7 C)  TempSrc:   Oral Oral  SpO2: 95% 97% 98% 94%  Weight:      Height:        Constitutional: NAD, alert and oriented x 3 Eyes: PERRL, lids and conjunctivae normal ENMT: Mucous membranes are dry  Neck: normal, supple, no masses, no thyromegaly Respiratory: clear to auscultation bilaterally, no wheezing, no crackles. Normal respiratory effort. No accessory muscle  use.  Cardiovascular: Tachycardia, no murmurs / rubs / gallops. No extremity edema. 2+ pedal pulses. No carotid bruits.  Abdomen: no tenderness, no masses palpated. No hepatosplenomegaly. Bowel sounds positive.  Musculoskeletal: no clubbing / cyanosis. No joint deformity upper and lower extremities.  Skin: no rashes, lesions, ulcers.  Neurologic: No gross focal neurologic deficit. Psychiatric: Normal mood and affect.   Labs on Admission: I have personally reviewed following labs and imaging studies  CBC: Recent Labs  Lab 06/04/20 1847  WBC 11.9*  HGB 17.1*  HCT 48.7*  MCV 85.7  PLT 395   Basic Metabolic Panel: Recent Labs  Lab 06/04/20 1847  NA 135  K 4.4  CL 87*  CO2 23  GLUCOSE 121*  BUN 68*  CREATININE 4.70*  CALCIUM 9.1   GFR: Estimated Creatinine Clearance: 10.2 mL/min (A) (by C-G formula based on SCr of 4.7 mg/dL (H)). Liver Function Tests: Recent Labs  Lab 06/04/20 1847  AST 46*  ALT 29  ALKPHOS 106  BILITOT 1.0  PROT 9.0*  ALBUMIN 5.3*   Recent Labs  Lab 06/04/20 1847  LIPASE 20   No results for input(s): AMMONIA in the last 168 hours. Coagulation Profile: No results for input(s): INR, PROTIME in the last 168 hours. Cardiac Enzymes: Recent Labs  Lab 06/05/20 1455  CKTOTAL 1,139*   BNP (last 3 results) No results for input(s): PROBNP in the last 8760 hours. HbA1C: No results for input(s): HGBA1C in the last 72 hours. CBG: No results for input(s): GLUCAP in the last 168 hours. Lipid Profile: No results for input(s): CHOL, HDL, LDLCALC, TRIG, CHOLHDL, LDLDIRECT in the last 72 hours. Thyroid Function Tests: No results for input(s): TSH, T4TOTAL, FREET4, T3FREE, THYROIDAB in the last 72 hours. Anemia Panel: No results for input(s): VITAMINB12, FOLATE, FERRITIN, TIBC, IRON, RETICCTPCT in the last 72 hours. Urine analysis:    Component Value Date/Time   COLORURINE AMBER (A) 06/04/2020 1847   APPEARANCEUR CLOUDY (A) 06/04/2020 1847    LABSPEC 1.018 06/04/2020 1847   PHURINE 5.0 06/04/2020 1847   GLUCOSEU NEGATIVE 06/04/2020 1847   HGBUR SMALL (A) 06/04/2020 1847   BILIRUBINUR MODERATE (A) 06/04/2020 1847   KETONESUR NEGATIVE 06/04/2020 1847   PROTEINUR 30 (A) 06/04/2020 1847   NITRITE NEGATIVE 06/04/2020 1847   LEUKOCYTESUR NEGATIVE 06/04/2020 1847    Radiological Exams on Admission: CT Head Wo Contrast  Result Date: 06/05/2020 CLINICAL DATA:  Body aches, emesis, syncopal episodes for 3 days EXAM: CT HEAD WITHOUT CONTRAST TECHNIQUE: Contiguous axial images were obtained from the base of the skull through the vertex without intravenous contrast. COMPARISON:  None. FINDINGS: Brain: No evidence of acute infarction, hemorrhage, hydrocephalus, extra-axial collection or mass  lesion/mass effect. Symmetric prominence of the ventricles, cisterns and sulci compatible with parenchymal volume loss. Patchy areas of white matter hypoattenuation are most compatible with chronic microvascular angiopathy. Vascular: Atherosclerotic calcification of the carotid siphons and intradural vertebral arteries. No hyperdense vessel. Skull: No calvarial fracture or suspicious osseous lesion. No scalp swelling or hematoma. Sinuses/Orbits: Paranasal sinuses and mastoid air cells are predominantly clear. Included orbital structures are unremarkable. Other: Mild bilateral TMJ arthrosis. IMPRESSION: 1. No acute intracranial findings. 2. Chronic microvascular angiopathy and parenchymal volume loss. Electronically Signed   By: Kreg Shropshire M.D.   On: 06/05/2020 04:28   CT Renal Stone Study  Result Date: 06/05/2020 CLINICAL DATA:  Body aches, vomiting and episodes of syncope for 3 days, denies dysuria or fever EXAM: CT ABDOMEN AND PELVIS WITHOUT CONTRAST TECHNIQUE: Multidetector CT imaging of the abdomen and pelvis was performed following the standard protocol without IV contrast. COMPARISON:  CT 06/29/2011 FINDINGS: Lower chest: Extensive respiratory motion  artifact in the lung bases. Some coarsened interstitial changes and reticular areas of scarring are similar to comparison from 20/12. Lung bases are otherwise clear. Small fat containing right Bochdalek's hernia. Normal heart size. No pericardial effusion. Few calcifications on the aortic leaflets and mitral annulus. Coronary artery calcifications as well. Minimal pericardial thickening anteriorly. Hepatobiliary: No visible concerning liver lesions. Normal liver attenuation. Smooth liver surface contour. Gradient attenuation within the gallbladder, could reflect biliary sludge. No pericholecystic fluid or inflammation. No frank biliary ductal dilatation or visible intraductal gallstones. Pancreas: Unremarkable. No pancreatic ductal dilatation or surrounding inflammatory changes. Spleen: Normal in size. No concerning splenic lesions. Adrenals/Urinary Tract: Normal adrenal glands. Kidneys are symmetric in size and normally located. Few areas of cortical scarring noted bilaterally. No visible concerning renal lesions. No urolithiasis or hydronephrosis. Suspect a small vascular calcifications seen in the upper pole right kidney. Urinary bladder is largely decompressed at the time of exam and therefore poorly evaluated by CT imaging. No gross bladder abnormality accounting for underdistention. Stomach/Bowel: Distal esophagus is unremarkable. Some mild gastric thickening likely related to underdistention. Duodenum crosses the midline abdomen normally. Multiple air and fluid-filled loops of small bowel with mural thickening including several loops which are borderline distended up to 3.2 cm. Transitions to the dilated segment seen in the midline abdomen (2/51) and right upper quadrant (2/38). Some associated hazy mesenteric stranding is noted as well. There is intramural fat within the cecum and ascending colon. Some mild edematous thickening of the colonic wall is also noted. Few noninflamed colonic diverticula.  Colorectal anastomosis in the low midline pelvis (2/73) appears patent. Vascular/Lymphatic: Atherosclerotic calcifications within the abdominal aorta and branch vessels. No aneurysm or ectasia. No enlarged abdominopelvic lymph nodes. Some reactive mesenteric nodes are present with hazy mesenteric stranding. Reproductive: Uterus appears surgically absent. No concerning adnexal lesions. Other: No abdominopelvic free fluid or free gas. No bowel containing hernias. Small fat containing left femoral hernia (2/69). Musculoskeletal: The osseous structures appear diffusely demineralized which may limit detection of small or nondisplaced fractures. No acute osseous abnormality or suspicious osseous lesion. Multilevel degenerative changes are present in the imaged portions of the spine. Features most pronounced L5-S1. Additional mild degenerative changes in the hips and pelvis. IMPRESSION: 1. Multiple air and fluid-filled loops of small bowel with mild mural thickening. Additional mild edematous mural thickening of the colon as well. Features are concerning for an acute infectious or inflammatory enterocolitis. 2. Segmental thickening of several loops in the right hemiabdomen with transition points in the upper midline abdomen  and right upper quadrant. Could reflect a focal ileus or partial obstruction. 3. Intramural fat of the cecum and ascending colon could reflect sequela of prior inflammatory change. 4. Gradient attenuation within the gallbladder, could reflect biliary sludge. No pericholecystic fluid or inflammation. Correlate with abdominal symptoms and if there is further clinical concern, right upper quadrant ultrasound could be obtained. 5. Aortic Atherosclerosis (ICD10-I70.0). Electronically Signed   By: Kreg ShropshirePrice  DeHay M.D.   On: 06/05/2020 04:37    EKG: Independently reviewed.  Normal sinus rhythm  Assessment/Plan Principal Problem:   AKI (acute kidney injury) (HCC) Active Problems:   Syncope and collapse    Nicotine dependence     Acute kidney injury Most likely prerenal secondary to GI losses from nausea and vomiting related to acute food poisoning At baseline patient has a serum creatinine of 0.7 from 2016 and today on admission her serum creatinine is 4.7 with elevated BUN Aggressive IV fluid resuscitation Repeat renal parameters in a.m.   Syncope and collapse Most likely vasovagal Patient was hypotensive upon arrival to the ER and improved following IV fluid resuscitation We will place patient on a cardiac monitor to rule out arrhythmias as a cause of her syncope Obtain 2D echocardiogram to assess LVEF   Nicotine dependence Patient smokes about 1/2 pack of cigarettes daily Smoking cessation was discussed with patient in detail she declines a nicotine patch at this time   Possible small bowel obstruction/ileus Appreciate surgical consult Recommendations noted   Hypothyroidism Continue Synthroid   DVT prophylaxis: Lovenox Code Status: Full code Family Communication: Greater than 50% of time was spent discussing plan of care with patient at the bedside.  She verbalizes understanding and agrees with the plan. Disposition Plan: Back to previous home environment Consults called: Surgery    Freddi Forster MD [facility]     06/05/2020, 3:56 PM

## 2020-06-05 NOTE — Consult Note (Signed)
SURGICAL CONSULTATION NOTE   HISTORY OF PRESENT ILLNESS (HPI):  69 y.o. female [facility] ED for evaluation of nausea and vomiting since 4 days ago. Patient reports she started having abdominal pain, nausea and vomiting since 4 days ago when she had lunch. She has been vomiting since then. She reports that she has not eaten since Sunday. At the moment of my evaluation the patient reports feeling better and she denies any nausea. Since she was complaining of abdominal pain, she had CT scan of the abdomen and pelvis. Ct scan shows inflamed intestine and mild dilation. No free air or free fluid. I personally evaluated the images of the CT scan.   Surgery is consulted by Dr. Fanny BienQuale in this context for evaluation and management of gastroenteritis.  PAST MEDICAL HISTORY (PMH):  Hypothyroidism  PAST SURGICAL HISTORY (PSH):  Partial colectomy due to diverticulitis  MEDICATIONS:  Prior to Admission medications   Medication Sig Start Date End Date Taking? Authorizing Provider  Cyanocobalamin 1000 MCG TBCR Take 1 tablet by mouth daily.   Yes [provider]  fish oil-omega-3 fatty acids 1000 MG capsule Take 2 g by mouth daily.   Yes [provider]  levothyroxine (SYNTHROID, LEVOTHROID) 112 MCG tablet Take 112 mcg by mouth daily before breakfast.   Yes [provider]  omeprazole (PRILOSEC) 40 MG capsule Take 40 mg by mouth daily.   Yes [provider]     ALLERGIES:  No Known Allergies   SOCIAL HISTORY:  Social History   Socioeconomic History  . Marital status: Single    Spouse name: Not on file  . Number of children: Not on file  . Years of education: Not on file  . Highest education level: Not on file  Occupational History  . Not on file  Tobacco Use  . Smoking status: Not on file  Substance and Sexual Activity  . Alcohol use: Not on file  . Drug use: Not on file  . Sexual activity: Not on file  Other Topics Concern  . Not on file   Social History Narrative  . Not on file   Social Determinants of Health   Financial Resource Strain:   . Difficulty of Paying Living Expenses: Not on file  Food Insecurity:   . Worried About Programme researcher, broadcasting/film/videounning Out of Food in the Last Year: Not on file  . Ran Out of Food in the Last Year: Not on file  Transportation Needs:   . Lack of Transportation (Medical): Not on file  . Lack of Transportation (Non-Medical): Not on file  Physical Activity:   . Days of Exercise per Week: Not on file  . Minutes of Exercise per Session: Not on file  Stress:   . Feeling of Stress : Not on file  Social Connections:   . Frequency of Communication with Friends and Family: Not on file  . Frequency of Social Gatherings with Friends and Family: Not on file  . Attends Religious Services: Not on file  . Active Member of Clubs or Organizations: Not on file  . Attends BankerClub or Organization Meetings: Not on file  . Marital Status: Not on file  Intimate Partner Violence:   . Fear of Current or Ex-Partner: Not on file  . Emotionally Abused: Not on file  . Physically Abused: Not on file  . Sexually Abused: Not on file      FAMILY HISTORY:  Family history reviewed. No pertinent family history  REVIEW OF SYSTEMS:  Constitutional: denies  weight loss, fever, chills, or sweats  Eyes: denies any other vision changes, history of eye injury  ENT: denies sore throat, hearing problems  Respiratory: denies shortness of breath, wheezing  Cardiovascular: denies chest pain, palpitations  Gastrointestinal: positive abdominal pain, nausea and vomiting Genitourinary: denies burning with urination or urinary frequency Musculoskeletal: denies any other joint pains or cramps  Skin: denies any other rashes or skin discolorations  Neurological: denies any other headache, dizziness, weakness  Psychiatric: denies any other depression, anxiety   All other review of systems were negative   VITAL SIGNS:  Temp:  [97.8 F (36.6  C)-98.5 F (36.9 C)] 98.1 F (36.7 C) (08/19 0838) Pulse Rate:  [92-102] 102 (08/19 0838) Resp:  [16-20] 18 (08/19 0838) BP: (90-129)/(57-79) 90/78 (08/19 0838) SpO2:  [94 %-98 %] 94 % (08/19 0838) Weight:  [63.5 kg] 63.5 kg (08/18 1837)     Height: 5\' 5"  (165.1 cm) Weight: 63.5 kg BMI (Calculated): 23.3   INTAKE/OUTPUT:  This shift: Total I/O In: 999 [IV Piggyback:999] Out: -   Last 2 shifts: @IOLAST2SHIFTS @   PHYSICAL EXAM:  Constitutional:  -- Normal body habitus  -- Awake, alert, and oriented x3  Eyes:  -- Pupils equally round and reactive to light  -- No scleral icterus  Ear, nose, and throat:  -- No jugular venous distension  Pulmonary:  -- No crackles  -- Equal breath sounds bilaterally -- Breathing non-labored at rest Cardiovascular:  -- S1, S2 present  -- No pericardial rubs Gastrointestinal:  -- Abdomen soft, nontender, non-distended, no guarding or rebound tenderness -- No abdominal masses appreciated, pulsatile or otherwise  Musculoskeletal and Integumentary:  -- Wounds: None appreciated -- Extremities: B/L UE and LE FROM, hands and feet warm, no edema  Neurologic:  -- Motor function: intact and symmetric -- Sensation: intact and symmetric   Labs:  CBC Latest Ref Rng & Units 06/04/2020  WBC 4.0 - 10.5 K/uL 11.9(H)  Hemoglobin 12.0 - 15.0 g/dL 17.1(H)  Hematocrit 36 - 46 % 48.7(H)  Platelets 150 - 400 K/uL 395   CMP Latest Ref Rng & Units 06/04/2020  Glucose 70 - 99 mg/dL 06/06/2020)  BUN 8 - 23 mg/dL 06/06/2020)  Creatinine 322(G - 1.00 mg/dL 25(K)  Sodium 2.70 - 6.23(J mmol/L 135  Potassium 3.5 - 5.1 mmol/L 4.4  Chloride 98 - 111 mmol/L 87(L)  CO2 22 - 32 mmol/L 23  Calcium 8.9 - 10.3 mg/dL 9.1  Total Protein 6.5 - 8.1 g/dL 9.0(H)  Total Bilirubin 0.3 - 1.2 mg/dL 1.0  Alkaline Phos 38 - 126 U/L 106  AST 15 - 41 U/L 46(H)  ALT 0 - 44 U/L 29    Imaging studies:  EXAM: CT ABDOMEN AND PELVIS WITHOUT CONTRAST  TECHNIQUE: Multidetector CT imaging of the  abdomen and pelvis was performed following the standard protocol without IV contrast.  COMPARISON:  CT 06/29/2011  FINDINGS: Lower chest: Extensive respiratory motion artifact in the lung bases. Some coarsened interstitial changes and reticular areas of scarring are similar to comparison from 20/12. Lung bases are otherwise clear. Small fat containing right Bochdalek's hernia. Normal heart size. No pericardial effusion. Few calcifications on the aortic leaflets and mitral annulus. Coronary artery calcifications as well. Minimal pericardial thickening anteriorly.  Hepatobiliary: No visible concerning liver lesions. Normal liver attenuation. Smooth liver surface contour. Gradient attenuation within the gallbladder, could reflect biliary sludge. No pericholecystic fluid or inflammation. No frank biliary ductal dilatation or visible intraductal gallstones.  Pancreas: Unremarkable. No pancreatic ductal dilatation  or surrounding inflammatory changes.  Spleen: Normal in size. No concerning splenic lesions.  Adrenals/Urinary Tract: Normal adrenal glands. Kidneys are symmetric in size and normally located. Few areas of cortical scarring noted bilaterally. No visible concerning renal lesions. No urolithiasis or hydronephrosis. Suspect a small vascular calcifications seen in the upper pole right kidney. Urinary bladder is largely decompressed at the time of exam and therefore poorly evaluated by CT imaging. No gross bladder abnormality accounting for underdistention.  Stomach/Bowel: Distal esophagus is unremarkable. Some mild gastric thickening likely related to underdistention. Duodenum crosses the midline abdomen normally. Multiple air and fluid-filled loops of small bowel with mural thickening including several loops which are borderline distended up to 3.2 cm. Transitions to the dilated segment seen in the midline abdomen (2/51) and right upper quadrant (2/38). Some associated  hazy mesenteric stranding is noted as well. There is intramural fat within the cecum and ascending colon. Some mild edematous thickening of the colonic wall is also noted. Few noninflamed colonic diverticula. Colorectal anastomosis in the low midline pelvis (2/73) appears patent.  Vascular/Lymphatic: Atherosclerotic calcifications within the abdominal aorta and branch vessels. No aneurysm or ectasia. No enlarged abdominopelvic lymph nodes. Some reactive mesenteric nodes are present with hazy mesenteric stranding.  Reproductive: Uterus appears surgically absent. No concerning adnexal lesions.  Other: No abdominopelvic free fluid or free gas. No bowel containing hernias. Small fat containing left femoral hernia (2/69).  Musculoskeletal: The osseous structures appear diffusely demineralized which may limit detection of small or nondisplaced fractures. No acute osseous abnormality or suspicious osseous lesion. Multilevel degenerative changes are present in the imaged portions of the spine. Features most pronounced L5-S1. Additional mild degenerative changes in the hips and pelvis.  IMPRESSION: 1. Multiple air and fluid-filled loops of small bowel with mild mural thickening. Additional mild edematous mural thickening of the colon as well. Features are concerning for an acute infectious or inflammatory enterocolitis. 2. Segmental thickening of several loops in the right hemiabdomen with transition points in the upper midline abdomen and right upper quadrant. Could reflect a focal ileus or partial obstruction. 3. Intramural fat of the cecum and ascending colon could reflect sequela of prior inflammatory change. 4. Gradient attenuation within the gallbladder, could reflect biliary sludge. No pericholecystic fluid or inflammation. Correlate with abdominal symptoms and if there is further clinical concern, right upper quadrant ultrasound could be obtained. 5. Aortic Atherosclerosis  (ICD10-I70.0).   Electronically Signed   By: Kreg Shropshire M.D.   On: 06/05/2020 04:37  Assessment/Plan:  69 y.o. female with gastroenteritis, complicated by pertinent comorbidities including dehydration.  Patient admitted with nausea vomiting and elevated creatinine due to dehydration.  At the moment of my evaluation she did not have any abdominal pain nausea or vomiting.  I think that is reasonable to start with clear liquid diet and assess for toleration.  At the moment of my evaluation patient did not have any IV fluids.  I seen the patient was improved with conservative management with IV fluids, nausea medication and pain medications.  I discussed with the patient that if she continued vomiting she might need an nasogastric tube for symptomatic relief.  I do not consider that this is a true bowel obstruction but more a reaction from the gastroenteritis with few amount of small bowel loops dilated.  I agree with hospitalist admission.  I will continue to follow along for the assistant of the management of this patient.  At this moment the patient does not want to proceed with any  surgical intervention and I think that it will not be needed.  Gae Gallop, MD

## 2020-06-06 ENCOUNTER — Inpatient Hospital Stay
Admit: 2020-06-06 | Discharge: 2020-06-06 | Disposition: A | Discharge status: Home / Self Care | Payer: No Typology Code available for payment source | Attending: Internal Medicine | Admitting: Internal Medicine

## 2020-06-06 ENCOUNTER — Encounter: Payer: Self-pay | Admitting: Internal Medicine

## 2020-06-06 DIAGNOSIS — R55 Syncope and collapse: Secondary | ICD-10-CM

## 2020-06-06 DIAGNOSIS — N179 Acute kidney failure, unspecified: Principal | ICD-10-CM

## 2020-06-06 LAB — ECHOCARDIOGRAM COMPLETE
AR max vel: 3.19 cm2
AV Area VTI: 3.49 cm2
AV Area mean vel: 3.07 cm2
AV Mean grad: 2 mmHg
AV Peak grad: 3.8 mmHg
Ao pk vel: 0.98 m/s
Area-P 1/2: 2.9 cm2
Height: 65 in
S' Lateral: 2.16 cm
Weight: 2317.48 oz

## 2020-06-06 LAB — BASIC METABOLIC PANEL
Anion gap: 11 (ref 5–15)
BUN: 73 mg/dL — ABNORMAL HIGH (ref 8–23)
CO2: 22 mmol/L (ref 22–32)
Calcium: 8.1 mg/dL — ABNORMAL LOW (ref 8.9–10.3)
Chloride: 100 mmol/L (ref 98–111)
Creatinine, Ser: 2.13 mg/dL — ABNORMAL HIGH (ref 0.44–1.00)
GFR calc Af Amer: 27 mL/min — ABNORMAL LOW (ref >60)
GFR calc non Af Amer: 23 mL/min — ABNORMAL LOW (ref >60)
Glucose, Bld: 88 mg/dL (ref 70–99)
Potassium: 3.6 mmol/L (ref 3.5–5.1)
Sodium: 133 mmol/L — ABNORMAL LOW (ref 135–145)

## 2020-06-06 LAB — CBC
HCT: 36.6 % (ref 36.0–46.0)
Hemoglobin: 13.1 g/dL (ref 12.0–15.0)
MCH: 30 pg (ref 26.0–34.0)
MCHC: 35.8 g/dL (ref 30.0–36.0)
MCV: 83.8 fL (ref 80.0–100.0)
Platelets: 325 10*3/uL (ref 150–400)
RBC: 4.37 MIL/uL (ref 3.87–5.11)
RDW: 12.1 % (ref 11.5–15.5)
WBC: 8 10*3/uL (ref 4.0–10.5)
nRBC: 0 % (ref 0.0–0.2)

## 2020-06-06 NOTE — Progress Notes (Signed)
Pt getting cleaned up with help from tech. Pt denies any pain. Pt denies N/V. Scheduled meds given and new bag of fluids hung. Pt denies any further needs at this time, call bell within reach

## 2020-06-06 NOTE — Plan of Care (Signed)

## 2020-06-06 NOTE — Progress Notes (Signed)
*  PRELIMINARY RESULTS* Echocardiogram 2D Echocardiogram has been performed.  Cristela Blue 06/06/2020, 1:40 PM

## 2020-06-06 NOTE — Progress Notes (Signed)
Pt sitting in bed watching tv. Pt denies any needs at this time. Call bell within reach.

## 2020-06-06 NOTE — Plan of Care (Signed)
No vomiting this shift. Pt states she is feeling a bit better. Problem: Education: Goal: Knowledge of General Education information will improve Description: Including pain rating scale, medication(s)/side effects and non-pharmacologic comfort measures Outcome: Progressing   Problem: Health Behavior/Discharge Planning: Goal: Ability to manage health-related needs will improve Outcome: Progressing   Problem: Clinical Measurements: Goal: Ability to maintain clinical measurements within normal limits will improve Outcome: Progressing Goal: Will remain free from infection Outcome: Progressing Goal: Diagnostic test results will improve Outcome: Progressing Goal: Respiratory complications will improve Outcome: Progressing Goal: Cardiovascular complication will be avoided Outcome: Progressing   Problem: Activity: Goal: Risk for activity intolerance will decrease Outcome: Progressing   Problem: Nutrition: Goal: Adequate nutrition will be maintained Outcome: Progressing   Problem: Coping: Goal: Level of anxiety will decrease Outcome: Progressing   Problem: Elimination: Goal: Will not experience complications related to bowel motility Outcome: Progressing Goal: Will not experience complications related to urinary retention Outcome: Progressing   Problem: Pain Managment: Goal: General experience of comfort will improve Outcome: Progressing   Problem: Safety: Goal: Ability to remain free from injury will improve Outcome: Progressing   Problem: Skin Integrity: Goal: Risk for impaired skin integrity will decrease Outcome: Progressing

## 2020-06-06 NOTE — Progress Notes (Signed)
Pt eating lunch, denies pain. Scheduled meds given and IV fluid rate changed. Pt denies any further  Needs at this time. Call bell within reach

## 2020-06-06 NOTE — Progress Notes (Addendum)
Progress Note    Stacy Schmidt  AVW:[id number] DOB: [birth year]  DOA: 06/05/2020 PCP: [facility]      Brief Narrative:    Medical records reviewed and are as summarized below:  Stacy Schmidt is a 69 y.o. female       Assessment/Plan:   Principal Problem:   AKI (acute kidney injury) (HCC) Active Problems:   Syncope and collapse   Nicotine dependence   Acute kidney injury, dehydration-improving S/p syncope-likely vasovagal from dehydration/hypotension Hypotension Nausea and vomiting/food poisoning/gastroenteritis Rhabdomyolysis Tobacco use disorder Hypothyroidism  PLAN  She has been evaluated by general surgeon.  No evidence of bowel obstruction at this time. Advance diet as tolerated 2D echo is pending Continue IV fluids Monitor BMP and CK level Discussed CODE STATUS.  She said she wants to be DNR.  Order has been changed in the computer to reflect  new CODE STATUS.   Body mass index is 24.1 kg/m.  Diet Order            Diet full liquid Room service appropriate? Yes; Fluid consistency: Thin  Diet effective now                   Medications:   . heparin injection (subcutaneous)  5,000 Units Subcutaneous Q8H  . levothyroxine  112 mcg Oral QAC breakfast  . omega-3 acid ethyl esters  2 g Oral Daily  . vitamin B-12  1,000 mcg Oral Daily   Continuous Infusions: . sodium chloride 125 mL/hr at 06/06/20 1016     Anti-infectives (From admission, onward)   None             Family Communication/Anticipated D/C date and plan/Code Status   DVT prophylaxis: heparin injection 5,000 Units Start: 06/05/20 2200     Code Status: DNR  Family Communication: Plan discussed with patient Disposition Plan:    Status is: Inpatient  Remains inpatient appropriate because:IV treatments appropriate due to intensity of illness or inability to take PO and Inpatient level of care appropriate due to severity of illness   Dispo:  The patient is from: Home              Anticipated d/c is to: Home              Anticipated d/c date is: 1 day              Patient currently is not medically stable to d/c.           Subjective:   C/o nausea/dry heaves.  No vomiting or abdominal pain.  She has moved her bowels twice.  No other complaints.  Objective:    Vitals:   06/06/20 0031 06/06/20 0033 06/06/20 0500 06/06/20 0817  BP:  (!) 114/59 (!) 110/50 (!) 103/56  Pulse:  80 71 82  Resp:  20 12 16   Temp:  97.9 F (36.6 C) 98 F (36.7 C) 97.7 F (36.5 C)  TempSrc:  Oral Oral Oral  SpO2:  97% 95% 96%  Weight: 65.7 kg     Height: 5\' 5"  (1.651 m)      Orthostatic VS for the past 24 hrs:  BP- Lying Pulse- Lying BP- Sitting Pulse- Sitting BP- Standing at 0 minutes Pulse- Standing at 0 minutes  06/05/20 1648 117/54 85 121/58 91 93/45 96     Intake/Output Summary (Last 24 hours) at 06/06/2020 1249 Last data filed at 06/06/2020 1031 Gross per 24 hour  Intake 120 ml  Output --  Net 120 ml   Filed Weights   06/04/20 1837 06/06/20 0031  Weight: 63.5 kg 65.7 kg    Exam:  GEN: NAD SKIN: No rash EYES: EOMI ENT: MMM CV: RRR PULM: CTA B ABD: soft, ND, NT, +BS CNS: AAO x 3, non focal EXT: No edema or tenderness   Data Reviewed:   I have personally reviewed following labs and imaging studies:  Labs: Labs show the following:   Basic Metabolic Panel: Recent Labs  Lab 06/04/20 1847 06/06/20 0549  NA 135 133*  K 4.4 3.6  CL 87* 100  CO2 23 22  GLUCOSE 121* 88  BUN 68* 73*  CREATININE 4.70* 2.13*  CALCIUM 9.1 8.1*   GFR Estimated Creatinine Clearance: 22.4 mL/min (A) (by C-G formula based on SCr of 2.13 mg/dL (H)). Liver Function Tests: Recent Labs  Lab 06/04/20 1847  AST 46*  ALT 29  ALKPHOS 106  BILITOT 1.0  PROT 9.0*  ALBUMIN 5.3*   Recent Labs  Lab 06/04/20 1847  LIPASE 20   No results for input(s): AMMONIA in the last 168 hours. Coagulation profile No results for  input(s): INR, PROTIME in the last 168 hours.  CBC: Recent Labs  Lab 06/04/20 1847 06/06/20 0549  WBC 11.9* 8.0  HGB 17.1* 13.1  HCT 48.7* 36.6  MCV 85.7 83.8  PLT 395 325   Cardiac Enzymes: Recent Labs  Lab 06/05/20 1455  CKTOTAL 1,139*   BNP (last 3 results) No results for input(s): PROBNP in the last 8760 hours. CBG: No results for input(s): GLUCAP in the last 168 hours. D-Dimer: No results for input(s): DDIMER in the last 72 hours. Hgb A1c: No results for input(s): HGBA1C in the last 72 hours. Lipid Profile: No results for input(s): CHOL, HDL, LDLCALC, TRIG, CHOLHDL, LDLDIRECT in the last 72 hours. Thyroid function studies: No results for input(s): TSH, T4TOTAL, T3FREE, THYROIDAB in the last 72 hours.  Invalid input(s): FREET3 Anemia work up: No results for input(s): VITAMINB12, FOLATE, FERRITIN, TIBC, IRON, RETICCTPCT in the last 72 hours. Sepsis Labs: Recent Labs  Lab 06/04/20 1847 06/05/20 1111 06/06/20 0549  WBC 11.9*  --  8.0  LATICACIDVEN  --  2.0*  --     Microbiology Recent Results (from the past 240 hour(s))  SARS Coronavirus 2 by RT PCR (hospital order, performed in [facility] hospital lab) Nasopharyngeal Nasopharyngeal Swab     Status: None   Collection Time: 06/04/20 11:11 AM   Specimen: Nasopharyngeal Swab  Result Value Ref Range Status   SARS Coronavirus 2 NEGATIVE NEGATIVE Final    Comment: (NOTE) SARS-CoV-2 target nucleic acids are NOT DETECTED.  The SARS-CoV-2 RNA is generally detectable in upper and lower respiratory specimens during the acute phase of infection. The lowest concentration of SARS-CoV-2 viral copies this assay can detect is 250 copies / mL. A negative result does not preclude SARS-CoV-2 infection and should not be used as the sole basis for treatment or other patient management decisions.  A negative result may occur with improper specimen collection / handling, submission of specimen other than nasopharyngeal swab,  presence of viral mutation(s) within the areas targeted by this assay, and inadequate number of viral copies (<250 copies / mL). A negative result must be combined with clinical observations, patient history, and epidemiological information.  Fact Sheet for Patients:   BoilerBrush.com.cy  Fact Sheet for Healthcare Providers: https://pope.com/  This test is not yet approved or  cleared by the [place] FDA and  has been authorized for detection and/or diagnosis of SARS-CoV-2 by FDA under an Emergency Use Authorization (EUA).  This EUA will remain in effect (meaning this test can be used) for the duration of the COVID-19 declaration under Section 564(b)(1) of the Act, 21 U.S.C. section 360bbb-3(b)(1), unless the authorization is terminated or revoked sooner.  Performed at [facility], [address] [place], [place] [address]     Procedures and diagnostic studies:  CT Head Wo Contrast  Result Date: 06/05/2020 CLINICAL DATA:  Body aches, emesis, syncopal episodes for 3 days EXAM: CT HEAD WITHOUT CONTRAST TECHNIQUE: Contiguous axial images were obtained from the base of the skull through the vertex without intravenous contrast. COMPARISON:  None. FINDINGS: Brain: No evidence of acute infarction, hemorrhage, hydrocephalus, extra-axial collection or mass lesion/mass effect. Symmetric prominence of the ventricles, cisterns and sulci compatible with parenchymal volume loss. Patchy areas of white matter hypoattenuation are most compatible with chronic microvascular angiopathy. Vascular: Atherosclerotic calcification of the carotid siphons and intradural vertebral arteries. No hyperdense vessel. Skull: No calvarial fracture or suspicious osseous lesion. No scalp swelling or hematoma. Sinuses/Orbits: Paranasal sinuses and mastoid air cells are predominantly clear. Included orbital structures are unremarkable. Other: Mild bilateral TMJ  arthrosis. IMPRESSION: 1. No acute intracranial findings. 2. Chronic microvascular angiopathy and parenchymal volume loss. Electronically Signed   By: Kreg Shropshire M.D.   On: 06/05/2020 04:28   CT Renal Stone Study  Result Date: 06/05/2020 CLINICAL DATA:  Body aches, vomiting and episodes of syncope for 3 days, denies dysuria or fever EXAM: CT ABDOMEN AND PELVIS WITHOUT CONTRAST TECHNIQUE: Multidetector CT imaging of the abdomen and pelvis was performed following the standard protocol without IV contrast. COMPARISON:  CT 06/29/2011 FINDINGS: Lower chest: Extensive respiratory motion artifact in the lung bases. Some coarsened interstitial changes and reticular areas of scarring are similar to comparison from 20/12. Lung bases are otherwise clear. Small fat containing right Bochdalek's hernia. Normal heart size. No pericardial effusion. Few calcifications on the aortic leaflets and mitral annulus. Coronary artery calcifications as well. Minimal pericardial thickening anteriorly. Hepatobiliary: No visible concerning liver lesions. Normal liver attenuation. Smooth liver surface contour. Gradient attenuation within the gallbladder, could reflect biliary sludge. No pericholecystic fluid or inflammation. No frank biliary ductal dilatation or visible intraductal gallstones. Pancreas: Unremarkable. No pancreatic ductal dilatation or surrounding inflammatory changes. Spleen: Normal in size. No concerning splenic lesions. Adrenals/Urinary Tract: Normal adrenal glands. Kidneys are symmetric in size and normally located. Few areas of cortical scarring noted bilaterally. No visible concerning renal lesions. No urolithiasis or hydronephrosis. Suspect a small vascular calcifications seen in the upper pole right kidney. Urinary bladder is largely decompressed at the time of exam and therefore poorly evaluated by CT imaging. No gross bladder abnormality accounting for underdistention. Stomach/Bowel: Distal esophagus is  unremarkable. Some mild gastric thickening likely related to underdistention. Duodenum crosses the midline abdomen normally. Multiple air and fluid-filled loops of small bowel with mural thickening including several loops which are borderline distended up to 3.2 cm. Transitions to the dilated segment seen in the midline abdomen (2/51) and right upper quadrant (2/38). Some associated hazy mesenteric stranding is noted as well. There is intramural fat within the cecum and ascending colon. Some mild edematous thickening of the colonic wall is also noted. Few noninflamed colonic diverticula. Colorectal anastomosis in the low midline pelvis (2/73) appears patent. Vascular/Lymphatic: Atherosclerotic calcifications within the abdominal aorta and branch vessels. No aneurysm or ectasia. No enlarged abdominopelvic lymph nodes. Some reactive mesenteric nodes  are present with hazy mesenteric stranding. Reproductive: Uterus appears surgically absent. No concerning adnexal lesions. Other: No abdominopelvic free fluid or free gas. No bowel containing hernias. Small fat containing left femoral hernia (2/69). Musculoskeletal: The osseous structures appear diffusely demineralized which may limit detection of small or nondisplaced fractures. No acute osseous abnormality or suspicious osseous lesion. Multilevel degenerative changes are present in the imaged portions of the spine. Features most pronounced L5-S1. Additional mild degenerative changes in the hips and pelvis. IMPRESSION: 1. Multiple air and fluid-filled loops of small bowel with mild mural thickening. Additional mild edematous mural thickening of the colon as well. Features are concerning for an acute infectious or inflammatory enterocolitis. 2. Segmental thickening of several loops in the right hemiabdomen with transition points in the upper midline abdomen and right upper quadrant. Could reflect a focal ileus or partial obstruction. 3. Intramural fat of the cecum and  ascending colon could reflect sequela of prior inflammatory change. 4. Gradient attenuation within the gallbladder, could reflect biliary sludge. No pericholecystic fluid or inflammation. Correlate with abdominal symptoms and if there is further clinical concern, right upper quadrant ultrasound could be obtained. 5. Aortic Atherosclerosis (ICD10-I70.0). Electronically Signed   By: Kreg ShropshirePrice  DeHay M.D.   On: 06/05/2020 04:37               LOS: 1 day   Madylyn Insco  [facility]   Pager on [email or web address]. If 7PM-7AM, please contact night-coverage at [email or web address]     06/06/2020, 12:49 PM

## 2020-06-06 NOTE — Progress Notes (Signed)
SURGICAL PROGRESS NOTE   Hospital Day(s): 1.   Post op day(s):  Marland Kitchen   Interval History: Patient seen and examined, no acute events or new complaints overnight. Patient reports no abdominal pain.  She reports she tolerated clear liquid diet.  She reports that she is feeling hungry.  She denies nausea or vomiting.  There is some pain radiation.  There has been no alleviating or aggravating factor.  She denies any fever or chills.  She reports that she had 2 bowel movement and passing gas.  Vital signs in last 24 hours: [min-max] current  Temp:  [97.7 F (36.5 C)-98.1 F (36.7 C)] 97.7 F (36.5 C) (08/20 0817) Pulse Rate:  [71-85] 82 (08/20 0817) Resp:  [12-27] 16 (08/20 0817) BP: (80-143)/(50-116) 103/56 (08/20 0817) SpO2:  [93 %-98 %] 96 % (08/20 0817) Weight:  [65.7 kg] 65.7 kg (08/20 0031)     Height: 5\' 5"  (165.1 cm) Weight: 65.7 kg BMI (Calculated): 24.1   Physical Exam:  Constitutional: alert, cooperative and no distress  Respiratory: breathing non-labored at rest  Cardiovascular: regular rate and sinus rhythm  Gastrointestinal: soft, non-tender, and non-distended  Labs:  CBC Latest Ref Rng & Units 06/06/2020 06/04/2020  WBC 4.0 - 10.5 K/uL 8.0 11.9(H)  Hemoglobin 12.0 - 15.0 g/dL 06/06/2020 17.1(H)  Hematocrit 36 - 46 % 36.6 48.7(H)  Platelets 150 - 400 K/uL 325 395   CMP Latest Ref Rng & Units 06/06/2020 06/04/2020  Glucose 70 - 99 mg/dL 88 06/06/2020)  BUN 8 - 23 mg/dL 811(B) 14(N)  Creatinine 0.44 - 1.00 mg/dL 82(N) 5.62(Z)  Sodium 135 - 145 mmol/L 133(L) 135  Potassium 3.5 - 5.1 mmol/L 3.6 4.4  Chloride 98 - 111 mmol/L 100 87(L)  CO2 22 - 32 mmol/L 22 23  Calcium 8.9 - 10.3 mg/dL 8.1(L) 9.1  Total Protein 6.5 - 8.1 g/dL - 9.0(H)  Total Bilirubin 0.3 - 1.2 mg/dL - 1.0  Alkaline Phos 38 - 126 U/L - 106  AST 15 - 41 U/L - 46(H)  ALT 0 - 44 U/L - 29    Imaging studies: No new pertinent imaging studies   Assessment/Plan:  69 y.o. female with gastroenteritis, complicated by  pertinent comorbidities including dehydration.  Patient recovering adequately.  She has been aggressively hydrated with improvement of the creatinine from 4.7 to 2.13.  She reported that she has had 2 bowel movement and passing gas.  This clinical scenario is more from dehydration due to gastroenteritis and vomiting since Sunday without drinking or eating.  I do think that there was a true obstruction.  From my recommendation the diet can be advanced as tolerated.  No surgical management is indicated.  I will sign off but you can contact the surgery if you have any concern or there is any change clinically.  Saturday, MD

## 2020-06-07 LAB — BASIC METABOLIC PANEL
Anion gap: 10 (ref 5–15)
BUN: 29 mg/dL — ABNORMAL HIGH (ref 8–23)
CO2: 25 mmol/L (ref 22–32)
Calcium: 9 mg/dL (ref 8.9–10.3)
Chloride: 106 mmol/L (ref 98–111)
Creatinine, Ser: 0.98 mg/dL (ref 0.44–1.00)
GFR calc Af Amer: >60 mL/min (ref >60)
GFR calc non Af Amer: 59 mL/min — ABNORMAL LOW (ref >60)
Glucose, Bld: 88 mg/dL (ref 70–99)
Potassium: 4.3 mmol/L (ref 3.5–5.1)
Sodium: 141 mmol/L (ref 135–145)

## 2020-06-07 LAB — CK: Total CK: 310 U/L — ABNORMAL HIGH (ref 38–234)

## 2020-06-07 NOTE — Progress Notes (Signed)
RN and pt went over discharge education and IV removed. Pt ready for discharge

## 2020-06-07 NOTE — Discharge Summary (Signed)
Physician Discharge Summary  Stacy Schmidt:[id number] DOB: [birth year] DOA: 06/05/2020  PCP: [facility]  Admit date: 06/05/2020 Discharge date: 06/07/2020  Discharge disposition: Home   Recommendations for Outpatient Follow-Up:   Follow-up with PCP in 1 to 2 weeks   Discharge Diagnosis:   Principal Problem:   AKI (acute kidney injury) (HCC) Active Problems:   Syncope and collapse   Nicotine dependence    Discharge Condition: Stable.  Diet recommendation:  Diet Order            Diet Heart Room service appropriate? Yes; Fluid consistency: Thin  Diet effective now           Diet - low sodium heart healthy                   Code Status: DNR     Hospital Course:   Ms. Stacy Schmidt is a 69 y.o. female with medical history significant for nicotine dependence and hypothyroidism who presents to the emergency room for evaluation of several days of nausea, vomiting and abdominal discomfort.  Patient states that she had eaten a cheeseburger from [facility] [employer] about 4 days prior to admission.  Later, she developed nausea and vomiting.  She said she had woken up multiple times on the bathroom floor and she thought she had passed out.  CT scan of the abdomen and pelvis showed multiple and fluid-filled loops of small bowel concerning for possible small bowel obstruction versus ileus.  She was evaluated by the general surgeon who said there was no evidence of bowel obstruction.  Work-up also revealed acute kidney injury, hyponatremia and rhabdomyolysis (likely from falling on the floor from syncope).  She was treated with IV fluids and antiemetics.  All her symptoms have resolved and kidney function has returned to normal.  Her diet was slowly advanced and she tolerated her diet without any problems.  2D echo showed grade 1 diastolic dysfunction and EF of 60 to 65%.  Her condition has improved and she is deemed stable for discharge to home today.      Discharge  Exam:    Vitals:   06/06/20 0817 06/06/20 1531 06/07/20 0051 06/07/20 0800  BP: (!) 103/56 (!) 124/57 116/62 120/66  Pulse: 82 61 64 (!) 56  Resp: 16 16 18 16   Temp: 97.7 F (36.5 C) 97.7 F (36.5 C) 98.1 F (36.7 C) 97.9 F (36.6 C)  TempSrc: Oral Oral Oral Oral  SpO2: 96% 99% 97% 100%  Weight:      Height:         GEN: NAD SKIN: No rash EYES: EOMI ENT: MMM CV: RRR PULM: CTA B ABD: soft, ND, NT, +BS CNS: AAO x 3, non focal EXT: No edema or tenderness   The results of significant diagnostics from this hospitalization (including imaging, microbiology, ancillary and laboratory) are listed below for reference.     Procedures and Diagnostic Studies:   ECHOCARDIOGRAM COMPLETE  Result Date: 06/06/2020    ECHOCARDIOGRAM REPORT   Patient Name:   Stacy Schmidt Date of Exam: 06/06/2020 Medical Rec #:  06/08/2020          Height:       65.0 in Accession #:    [id number]         Weight:       144.8 lb Date of Birth:  07-09-1951          BSA:          1.725  m Patient Age:    2 years           BP:           110/50 mmHg Patient Gender: F                  HR:           71 bpm. Exam Location:  [facility] Procedure: 2D Echo, Cardiac Doppler and Color Doppler Indications:     Syncope 780.2  History:         Patient has no prior history of Echocardiogram examinations. No                  medical history on file.  Sonographer:     Cristela Blue RDCS (AE) Referring Phys:  [id number] Lucile Shutters Diagnosing Phys: Harold Hedge MD  Sonographer Comments: Suboptimal apical window. IMPRESSIONS  1. Left ventricular ejection fraction, by estimation, is 60 to 65%. The left ventricle has normal function. The left ventricle has no regional wall motion abnormalities. There is mild left ventricular hypertrophy. Left ventricular diastolic parameters are consistent with Grade I diastolic dysfunction (impaired relaxation).  2. Right ventricular systolic function is normal. The right ventricular size is normal. There is  normal pulmonary artery systolic pressure.  3. The mitral valve was not well visualized. Trivial mitral valve regurgitation.  4. The aortic valve was not well visualized. Aortic valve regurgitation is not visualized. FINDINGS  Left Ventricle: Left ventricular ejection fraction, by estimation, is 60 to 65%. The left ventricle has normal function. The left ventricle has no regional wall motion abnormalities. The left ventricular internal cavity size was normal in size. There is  mild left ventricular hypertrophy. Left ventricular diastolic parameters are consistent with Grade I diastolic dysfunction (impaired relaxation). Right Ventricle: The right ventricular size is normal. No increase in right ventricular wall thickness. Right ventricular systolic function is normal. There is normal pulmonary artery systolic pressure. The tricuspid regurgitant velocity is 1.76 m/s, and  with an assumed right atrial pressure of 10 mmHg, the estimated right ventricular systolic pressure is 22.4 mmHg. Left Atrium: Left atrial size was normal in size. Right Atrium: Right atrial size was normal in size. Pericardium: There is no evidence of pericardial effusion. Mitral Valve: The mitral valve was not well visualized. Trivial mitral valve regurgitation. Tricuspid Valve: The tricuspid valve is not well visualized. Tricuspid valve regurgitation is trivial. Aortic Valve: The aortic valve was not well visualized. Aortic valve regurgitation is not visualized. Aortic valve mean gradient measures 2.0 mmHg. Aortic valve peak gradient measures 3.8 mmHg. Aortic valve area, by VTI measures 3.49 cm. Pulmonic Valve: The pulmonic valve was not well visualized. Pulmonic valve regurgitation is not visualized. Aorta: The aortic root was not well visualized. IAS/Shunts: No atrial level shunt detected by color flow Doppler.  LEFT VENTRICLE PLAX 2D LVIDd:         3.13 cm  Diastology LVIDs:         2.16 cm  LV e' lateral:   8.05 cm/s LV PW:         1.07 cm   LV E/e' lateral: 8.7 LV IVS:        0.92 cm  LV e' medial:    7.07 cm/s LVOT diam:     2.00 cm  LV E/e' medial:  9.9 LV SV:         75 LV SV Index:   44 LVOT Area:     3.14 cm  RIGHT  VENTRICLE RV Basal diam:  1.87 cm RV S prime:     16.50 cm/s TAPSE (M-mode): 3.2 cm LEFT ATRIUM           Index       RIGHT ATRIUM           Index LA diam:      2.80 cm 1.62 cm/m  RA Area:     11.00 cm LA Vol (A4C): 26.8 ml 15.54 ml/m RA Volume:   23.50 ml  13.63 ml/m  AORTIC VALVE                   PULMONIC VALVE AV Area (Vmax):    3.19 cm    RVOT Peak grad: 3 mmHg AV Area (Vmean):   3.07 cm AV Area (VTI):     3.49 cm AV Vmax:           97.60 cm/s AV Vmean:          71.600 cm/s AV VTI:            0.215 m AV Peak Grad:      3.8 mmHg AV Mean Grad:      2.0 mmHg LVOT Vmax:         99.20 cm/s LVOT Vmean:        70.000 cm/s LVOT VTI:          0.239 m LVOT/AV VTI ratio: 1.11  AORTA Ao Root diam: 2.60 cm MITRAL VALVE               TRICUSPID VALVE MV Area (PHT): 2.90 cm    TR Peak grad:   12.4 mmHg MV Decel Time: 262 msec    TR Vmax:        176.00 cm/s MV E velocity: 69.80 cm/s MV A velocity: 90.00 cm/s  SHUNTS MV E/A ratio:  0.78        Systemic VTI:  0.24 m                            Systemic Diam: 2.00 cm Harold HedgeKenneth Fath MD Electronically signed by Harold HedgeKenneth Fath MD Signature Date/Time: 06/06/2020/2:48:40 PM    Final      Labs:   Basic Metabolic Panel: Recent Labs  Lab 06/04/20 1847 06/04/20 1847 06/06/20 0549 06/07/20 0614  NA 135  --  133* 141  K 4.4   < > 3.6 4.3  CL 87*  --  100 106  CO2 23  --  22 25  GLUCOSE 121*  --  88 88  BUN 68*  --  73* 29*  CREATININE 4.70*  --  2.13* 0.98  CALCIUM 9.1  --  8.1* 9.0   < > = values in this interval not displayed.   GFR Estimated Creatinine Clearance: 48.8 mL/min (by C-G formula based on SCr of 0.98 mg/dL). Liver Function Tests: Recent Labs  Lab 06/04/20 1847  AST 46*  ALT 29  ALKPHOS 106  BILITOT 1.0  PROT 9.0*  ALBUMIN 5.3*   Recent Labs  Lab  06/04/20 1847  LIPASE 20   No results for input(s): AMMONIA in the last 168 hours. Coagulation profile No results for input(s): INR, PROTIME in the last 168 hours.  CBC: Recent Labs  Lab 06/04/20 1847 06/06/20 0549  WBC 11.9* 8.0  HGB 17.1* 13.1  HCT 48.7* 36.6  MCV 85.7 83.8  PLT 395 325   Cardiac Enzymes: Recent Labs  Lab 06/05/20  1455 06/07/20 0614  CKTOTAL 1,139* 310*   BNP: Invalid input(s): POCBNP CBG: No results for input(s): GLUCAP in the last 168 hours. D-Dimer No results for input(s): DDIMER in the last 72 hours. Hgb A1c No results for input(s): HGBA1C in the last 72 hours. Lipid Profile No results for input(s): CHOL, HDL, LDLCALC, TRIG, CHOLHDL, LDLDIRECT in the last 72 hours. Thyroid function studies No results for input(s): TSH, T4TOTAL, T3FREE, THYROIDAB in the last 72 hours.  Invalid input(s): FREET3 Anemia work up No results for input(s): VITAMINB12, FOLATE, FERRITIN, TIBC, IRON, RETICCTPCT in the last 72 hours. Microbiology Recent Results (from the past 240 hour(s))  SARS Coronavirus 2 by RT PCR (hospital order, performed in [facility] hospital lab) Nasopharyngeal Nasopharyngeal Swab     Status: None   Collection Time: 06/04/20 11:11 AM   Specimen: Nasopharyngeal Swab  Result Value Ref Range Status   SARS Coronavirus 2 NEGATIVE NEGATIVE Final    Comment: (NOTE) SARS-CoV-2 target nucleic acids are NOT DETECTED.  The SARS-CoV-2 RNA is generally detectable in upper and lower respiratory specimens during the acute phase of infection. The lowest concentration of SARS-CoV-2 viral copies this assay can detect is 250 copies / mL. A negative result does not preclude SARS-CoV-2 infection and should not be used as the sole basis for treatment or other patient management decisions.  A negative result may occur with improper specimen collection / handling, submission of specimen other than nasopharyngeal swab, presence of viral mutation(s) within  the areas targeted by this assay, and inadequate number of viral copies (<250 copies / mL). A negative result must be combined with clinical observations, patient history, and epidemiological information.  Fact Sheet for Patients:   BoilerBrush.com.cy  Fact Sheet for Healthcare Providers: https://pope.com/  This test is not yet approved or  cleared by the [place] FDA and has been authorized for detection and/or diagnosis of SARS-CoV-2 by FDA under an Emergency Use Authorization (EUA).  This EUA will remain in effect (meaning this test can be used) for the duration of the COVID-19 declaration under Section 564(b)(1) of the Act, 21 U.S.C. section 360bbb-3(b)(1), unless the authorization is terminated or revoked sooner.  Performed at [facility], [address], [place], [place] [address]      Discharge Instructions:   Discharge Instructions    Diet - low sodium heart healthy   Complete by: As directed    Increase activity slowly   Complete by: As directed      Allergies as of 06/07/2020   No Known Allergies     Medication List    TAKE these medications   Cyanocobalamin 1000 MCG Tbcr Take 1 tablet by mouth daily.   fish oil-omega-3 fatty acids 1000 MG capsule Take 2 g by mouth daily.   levothyroxine 112 MCG tablet Commonly known as: SYNTHROID Take 112 mcg by mouth daily before breakfast.   omeprazole 40 MG capsule Commonly known as: PRILOSEC Take 40 mg by mouth daily.         Time coordinating discharge: 28 minutes  Signed:  Hayden Kihara  [facility] 06/07/2020, 10:38 AM   Pager on [email or web address]. If 7PM-7AM, please contact night-coverage at [email or web address]

## 2020-06-07 NOTE — Discharge Instructions (Signed)

## 2020-06-07 NOTE — Progress Notes (Signed)
Pt sitting in bed, scheduled meds given. Pt waiting on breakfast. Call bell within reach

## 2020-06-07 NOTE — Plan of Care (Signed)
°  Problem: Education: Goal: Knowledge of General Education information will improve Description: Including pain rating scale, medication(s)/side effects and non-pharmacologic comfort measures 06/07/2020 1314 by Meyer Cory, RN Outcome: Adequate for Discharge 06/07/2020 1314 by Meyer Cory, RN Outcome: Adequate for Discharge   Problem: Health Behavior/Discharge Planning: Goal: Ability to manage health-related needs will improve 06/07/2020 1314 by Meyer Cory, RN Outcome: Adequate for Discharge 06/07/2020 1314 by Meyer Cory, RN Outcome: Adequate for Discharge   Problem: Clinical Measurements: Goal: Ability to maintain clinical measurements within normal limits will improve 06/07/2020 1314 by Meyer Cory, RN Outcome: Adequate for Discharge 06/07/2020 1314 by Meyer Cory, RN Outcome: Adequate for Discharge Goal: Will remain free from infection 06/07/2020 1314 by Meyer Cory, RN Outcome: Adequate for Discharge 06/07/2020 1314 by Meyer Cory, RN Outcome: Adequate for Discharge Goal: Diagnostic test results will improve 06/07/2020 1314 by Meyer Cory, RN Outcome: Adequate for Discharge 06/07/2020 1314 by Meyer Cory, RN Outcome: Adequate for Discharge Goal: Respiratory complications will improve 06/07/2020 1314 by Meyer Cory, RN Outcome: Adequate for Discharge 06/07/2020 1314 by Meyer Cory, RN Outcome: Adequate for Discharge Goal: Cardiovascular complication will be avoided 06/07/2020 1314 by Meyer Cory, RN Outcome: Adequate for Discharge 06/07/2020 1314 by Meyer Cory, RN Outcome: Adequate for Discharge   Problem: Activity: Goal: Risk for activity intolerance will decrease 06/07/2020 1314 by Meyer Cory, RN Outcome: Adequate for Discharge 06/07/2020 1314 by Meyer Cory, RN Outcome: Adequate for Discharge   Problem: Nutrition: Goal: Adequate nutrition will be maintained 06/07/2020 1314 by Meyer Cory, RN Outcome: Adequate for  Discharge 06/07/2020 1314 by Meyer Cory, RN Outcome: Adequate for Discharge   Problem: Coping: Goal: Level of anxiety will decrease 06/07/2020 1314 by Meyer Cory, RN Outcome: Adequate for Discharge 06/07/2020 1314 by Meyer Cory, RN Outcome: Adequate for Discharge   Problem: Elimination: Goal: Will not experience complications related to bowel motility 06/07/2020 1314 by Meyer Cory, RN Outcome: Adequate for Discharge 06/07/2020 1314 by Meyer Cory, RN Outcome: Adequate for Discharge Goal: Will not experience complications related to urinary retention 06/07/2020 1314 by Meyer Cory, RN Outcome: Adequate for Discharge 06/07/2020 1314 by Meyer Cory, RN Outcome: Adequate for Discharge   Problem: Pain Managment: Goal: General experience of comfort will improve 06/07/2020 1314 by Meyer Cory, RN Outcome: Adequate for Discharge 06/07/2020 1314 by Meyer Cory, RN Outcome: Adequate for Discharge   Problem: Safety: Goal: Ability to remain free from injury will improve 06/07/2020 1314 by Meyer Cory, RN Outcome: Adequate for Discharge 06/07/2020 1314 by Meyer Cory, RN Outcome: Adequate for Discharge   Problem: Skin Integrity: Goal: Risk for impaired skin integrity will decrease 06/07/2020 1314 by Meyer Cory, RN Outcome: Adequate for Discharge 06/07/2020 1314 by Meyer Cory, RN Outcome: Adequate for Discharge

## 2020-06-07 NOTE — Plan of Care (Signed)

## 2021-11-18 ENCOUNTER — Other Ambulatory Visit: Payer: Self-pay | Admitting: Family Medicine

## 2021-11-18 ENCOUNTER — Other Ambulatory Visit: Payer: Self-pay

## 2021-11-18 ENCOUNTER — Ambulatory Visit (INDEPENDENT_AMBULATORY_CARE_PROVIDER_SITE_OTHER): Payer: Medicare Other | Admitting: Family Medicine

## 2021-11-18 ENCOUNTER — Encounter: Payer: Self-pay | Admitting: Family Medicine

## 2021-11-18 VITALS — BP 144/60 | HR 67 | Ht 65.0 in | Wt 164.6 lb

## 2021-11-18 DIAGNOSIS — E7800 Pure hypercholesterolemia, unspecified: Secondary | ICD-10-CM

## 2021-11-18 DIAGNOSIS — E039 Hypothyroidism, unspecified: Secondary | ICD-10-CM

## 2021-11-18 DIAGNOSIS — Z7689 Persons encountering health services in other specified circumstances: Secondary | ICD-10-CM

## 2021-11-18 DIAGNOSIS — M170 Bilateral primary osteoarthritis of knee: Secondary | ICD-10-CM

## 2021-11-18 DIAGNOSIS — R7303 Prediabetes: Secondary | ICD-10-CM

## 2021-11-18 DIAGNOSIS — L570 Actinic keratosis: Secondary | ICD-10-CM

## 2021-11-18 DIAGNOSIS — Z23 Encounter for immunization: Secondary | ICD-10-CM | POA: Diagnosis not present

## 2021-11-18 DIAGNOSIS — K219 Gastro-esophageal reflux disease without esophagitis: Secondary | ICD-10-CM | POA: Diagnosis not present

## 2021-11-18 DIAGNOSIS — Z0000 Encounter for general adult medical examination without abnormal findings: Secondary | ICD-10-CM

## 2021-11-18 MED ORDER — SHINGRIX 50 MCG/0.5ML IM SUSR: INTRAMUSCULAR | 0 refills | Status: DC

## 2021-11-18 NOTE — Patient Instructions (Addendum)
Thank you for coming to the office today.  Shingrix Shingles vaccine 1 dose to pharmacy to be covered by insurance.  Call the office if you need medications refilled before next visit. Make sure to tell us the Name, Mg dosage, how often you take it, and #pills 90 and if it is mail order or not.  Actinic Keratosis or sun damage spot on face, recommend Dermatologist.  START anti inflammatory topical - OTC Voltaren (generic Diclofenac) topical 2-4 times a day as needed for pain swelling of affected joint for 1-2 weeks or longer.  Recommend to start taking Tylenol Extra Strength 500mg  tabs - take 1 to 2 tabs per dose (max 1000mg ) every 6-8 hours for pain (take regularly, don't skip a dose for next 7 days), max 24 hour daily dose is 6 tablets or 3000mg . In the future you can repeat the same everyday Tylenol course for 1-2 weeks at a time.   Use RICE therapy: - R - Rest / relative rest with activity modification avoid overuse of joint - I - Ice packs (make sure you use a towel or sock / something to protect skin) - C - Compression with flexible Knee Sleeve / ACE wrap to apply pressure and reduce swelling allowing more support - E - Elevation - if significant swelling, lift leg above heart level (toes above your nose) to help reduce swelling, most helpful at night after day of being on your feet   DUE for FASTING BLOOD WORK (no food or drink after midnight before the lab appointment, only water or coffee without cream/sugar on the morning of)  SCHEDULE "Lab Only" visit in the morning at the clinic for lab draw in 1 MONTHS   - Make sure Lab Only appointment is at about 1 week before your next appointment, so that results will be available  For Lab Results, once available within 2-3 days of blood draw, you can can log in to MyChart online to view your results and a brief explanation. Also, we can discuss results at next follow-up visit.   Please schedule a Follow-up Appointment to: Return in about  1 month (around 12/16/2021) for 4-6 weeks fasting lab only then 1 week later Annual Physical.  If you have any other questions or concerns, please feel free to call the office or send a message through MyChart. You may also schedule an earlier appointment if necessary.  Additionally, you may be receiving a survey about your experience at our office within a few days to 1 week by e-mail or mail. We value your feedback.  , DO [facility], 02/15/2022

## 2021-11-18 NOTE — Progress Notes (Signed)
Subjective:    Patient ID: Stacy Schmidt, female    DOB: 1951-03-06, 71 y.o.   MRN: [id number]  Stacy Schmidt is a 71 y.o. female presenting on 11/18/2021 for Establish Care  Establish care here with new PCP. [facility] [place]  HPI  Hypothyroidism Taking Levothyroxine 112 mcg daily  Pre-Diabetes Prior readings over years mild range elevated. No copy of results at this time.  Osteoarthritis Knees She is doing stationary bike exercises. She has been evaluated and they told her it was no problem. She has tried topical medication. She has limitation with prolonged walking can cause worsening soreness in knees.  GERD Chronic problem, on PPI Omeprazole 40mg  daily.  AKs Face and other areas with slightly red rough scaly skin spot on face upper body non healing. No sore. Using lotion.  Health Maintenance: UTD Flu Shot.  Shingrix last dose 12/16/20, need 2nd dose.  Declines Mammogram screening, last done 5-10 years, she is declining this.  Colonoscopy was done in [place] in past 2 years, she was advised to return in 4 more years.  Depression screen PHQ 2/9 11/18/2021  Decreased Interest 0  Down, Depressed, Hopeless 0  PHQ - 2 Score 0    Past Medical History:  Diagnosis Date   Diverticulitis    GERD (gastroesophageal reflux disease)    Hypothyroidism    History reviewed. No pertinent surgical history. Social History   Socioeconomic History   Marital status: Single    Spouse name: Not on file   Number of children: Not on file   Years of education: Not on file   Highest education level: Not on file  Occupational History   Not on file  Tobacco Use   Smoking status: Former    Packs/day: 1.00    Years: 50.00    Pack years: 50.00    Types: Cigarettes   Smokeless tobacco: Never  Vaping Use   Vaping Use: Never used  Substance and Sexual Activity   Alcohol use: Not Currently   Drug use: Not on file   Sexual activity: Not on file  Other  Topics Concern   Not on file  Social History Narrative   Not on file   Social Determinants of Health   Financial Resource Strain: Not on file  Food Insecurity: Not on file  Transportation Needs: Not on file  Physical Activity: Not on file  Stress: Not on file  Social Connections: Not on file  Intimate Partner Violence: Not on file   Family History  Problem Relation Age of Onset   Hypertension Mother    Congestive Heart Failure Mother    Hypertension Father    Stroke Father    Cancer Sister    Kidney disease Sister    Kidney cancer Sister 53   Breast cancer Sister    Cancer Brother    Kidney disease Brother    Aneurysm Brother    Lung cancer Brother    Current Outpatient Medications on File Prior to Visit  Medication Sig   Cyanocobalamin 1000 MCG TBCR Take 1 tablet by mouth daily.   fish oil-omega-3 fatty acids 1000 MG capsule Take 2 g by mouth daily.   levothyroxine (SYNTHROID) 100 MCG tablet Take 1 tablet (100 mcg total) by mouth daily before breakfast.   omeprazole (PRILOSEC) 40 MG capsule Take 40 mg by mouth daily.   No current facility-administered medications on file prior to visit.    Review of Systems Per HPI unless specifically indicated above  Objective:    BP (!) 144/60    Pulse 67    Ht 5\' 5"  (1.651 m)    Wt 164 lb 9.6 oz (74.7 kg)    SpO2 97%    BMI 27.39 kg/m   Wt Readings from Last 3 Encounters:  11/18/21 164 lb 9.6 oz (74.7 kg)  06/06/20 144 lb 13.5 oz (65.7 kg)    Physical Exam Vitals and nursing note reviewed.  Constitutional:      General: She is not in acute distress.    Appearance: Normal appearance. She is well-developed. She is not diaphoretic.     Comments: Well-appearing, comfortable, cooperative  HENT:     Head: Normocephalic and atraumatic.  Eyes:     General:        Right eye: No discharge.        Left eye: No discharge.     Conjunctiva/sclera: Conjunctivae normal.  Cardiovascular:     Rate and Rhythm: Normal rate.   Pulmonary:     Effort: Pulmonary effort is normal.  Skin:    General: Skin is warm and dry.     Findings: Lesion (Actinic keratosis on R cheek) present. No erythema or rash.  Neurological:     Mental Status: She is alert and oriented to person, place, and time.  Psychiatric:        Mood and Affect: Mood normal.        Behavior: Behavior normal.        Thought Content: Thought content normal.     Comments: Well groomed, good eye contact, normal speech and thoughts     Results for orders placed or performed during the hospital encounter of 06/05/20  SARS Coronavirus 2 by RT PCR (hospital order, performed in [facility]) Nasopharyngeal Nasopharyngeal Swab   Specimen: Nasopharyngeal Swab  Result Value Ref Range   SARS Coronavirus 2 NEGATIVE NEGATIVE  CBC  Result Value Ref Range   WBC 11.9 (H) 4.0 - 10.5 K/uL   RBC 5.68 (H) 3.87 - 5.11 MIL/uL   Hemoglobin 17.1 (H) 12.0 - 15.0 g/dL   HCT 16.148.7 (H) 09.636.0 - 04.546.0 %   MCV 85.7 80.0 - 100.0 fL   MCH 30.1 26.0 - 34.0 pg   MCHC 35.1 30.0 - 36.0 g/dL   RDW 40.912.3 81.111.5 - 91.415.5 %   Platelets 395 150 - 400 K/uL   nRBC 0.0 0.0 - 0.2 %  Comprehensive metabolic panel  Result Value Ref Range   Sodium 135 135 - 145 mmol/L   Potassium 4.4 3.5 - 5.1 mmol/L   Chloride 87 (L) 98 - 111 mmol/L   CO2 23 22 - 32 mmol/L   Glucose, Bld 121 (H) 70 - 99 mg/dL   BUN 68 (H) 8 - 23 mg/dL   Creatinine, Ser 7.824.70 (H) 0.44 - 1.00 mg/dL   Calcium 9.1 8.9 - 95.610.3 mg/dL   Total Protein 9.0 (H) 6.5 - 8.1 g/dL   Albumin 5.3 (H) 3.5 - 5.0 g/dL   AST 46 (H) 15 - 41 U/L   ALT 29 0 - 44 U/L   Alkaline Phosphatase 106 38 - 126 U/L   Total Bilirubin 1.0 0.3 - 1.2 mg/dL   GFR calc non Af Amer 9 (L) >60 mL/min   GFR calc Af Amer 10 (L) >60 mL/min   Anion gap 25 (H) 5 - 15  Lipase, blood  Result Value Ref Range   Lipase 20 11 - 51 U/L  Urinalysis, Complete w Microscopic  Result Value Ref Range   Color, Urine AMBER (A) YELLOW   APPearance CLOUDY (A) CLEAR    Specific Gravity, Urine 1.018 1.005 - 1.030   pH 5.0 5.0 - 8.0   Glucose, UA NEGATIVE NEGATIVE mg/dL   Hgb urine dipstick SMALL (A) NEGATIVE   Bilirubin Urine MODERATE (A) NEGATIVE   Ketones, ur NEGATIVE NEGATIVE mg/dL   Protein, ur 30 (A) NEGATIVE mg/dL   Nitrite NEGATIVE NEGATIVE   Leukocytes,Ua NEGATIVE NEGATIVE   RBC / HPF 0-5 0 - 5 RBC/hpf   WBC, UA 0-5 0 - 5 WBC/hpf   Bacteria, UA RARE (A) NONE SEEN   Squamous Epithelial / LPF 0-5 0 - 5   Mucus PRESENT    Hyaline Casts, UA PRESENT   Lactic acid, plasma  Result Value Ref Range   Lactic Acid, Venous 2.0 (HH) 0.5 - 1.9 mmol/L  CK  Result Value Ref Range   Total CK 1,139 (H) 38 - 234 U/L  HIV Antibody (routine testing w rflx)  Result Value Ref Range   HIV Screen 4th Generation wRfx Non Reactive Non Reactive  CBC  Result Value Ref Range   WBC 8.0 4.0 - 10.5 K/uL   RBC 4.37 3.87 - 5.11 MIL/uL   Hemoglobin 13.1 12.0 - 15.0 g/dL   HCT 58.8 50.2 - 77.4 %   MCV 83.8 80.0 - 100.0 fL   MCH 30.0 26.0 - 34.0 pg   MCHC 35.8 30.0 - 36.0 g/dL   RDW 12.8 78.6 - 76.7 %   Platelets 325 150 - 400 K/uL   nRBC 0.0 0.0 - 0.2 %  Basic metabolic panel  Result Value Ref Range   Sodium 133 (L) 135 - 145 mmol/L   Potassium 3.6 3.5 - 5.1 mmol/L   Chloride 100 98 - 111 mmol/L   CO2 22 22 - 32 mmol/L   Glucose, Bld 88 70 - 99 mg/dL   BUN 73 (H) 8 - 23 mg/dL   Creatinine, Ser 2.09 (H) 0.44 - 1.00 mg/dL   Calcium 8.1 (L) 8.9 - 10.3 mg/dL   GFR calc non Af Amer 23 (L) >60 mL/min   GFR calc Af Amer 27 (L) >60 mL/min   Anion gap 11 5 - 15  CK  Result Value Ref Range   Total CK 310 (H) 38 - 234 U/L  Basic metabolic panel  Result Value Ref Range   Sodium 141 135 - 145 mmol/L   Potassium 4.3 3.5 - 5.1 mmol/L   Chloride 106 98 - 111 mmol/L   CO2 25 22 - 32 mmol/L   Glucose, Bld 88 70 - 99 mg/dL   BUN 29 (H) 8 - 23 mg/dL   Creatinine, Ser 4.70 0.44 - 1.00 mg/dL   Calcium 9.0 8.9 - 96.2 mg/dL   GFR calc non Af Amer 59 (L) >60 mL/min    GFR calc Af Amer >60 >60 mL/min   Anion gap 10 5 - 15  ECHOCARDIOGRAM COMPLETE  Result Value Ref Range   Weight 2,317.48 oz   Height 65 in   BP 103/56 mmHg   Ao pk vel 0.98 m/s   AV Area VTI 3.49 cm2   AR max vel 3.19 cm2   AV Mean grad 2.0 mmHg   AV Peak grad 3.8 mmHg   S' Lateral 2.16 cm   AV Area mean vel 3.07 cm2   Area-P 1/2 2.90 cm2  Troponin I (High Sensitivity)  Result Value Ref Range   Troponin I (High  Sensitivity) 23 (H) <18 ng/L  Troponin I (High Sensitivity)  Result Value Ref Range   Troponin I (High Sensitivity) 22 (H) <18 ng/L      Assessment & Plan:   Problem List Items Addressed This Visit     Primary osteoarthritis of both knees   Pre-diabetes   Gastroesophageal reflux disease without esophagitis   Actinic keratoses   Acquired hypothyroidism - Primary   Relevant Medications   levothyroxine (SYNTHROID) 100 MCG tablet   Other Visit Diagnoses     Need for shingles vaccine       Relevant Medications   SHINGRIX injection   Encounter to establish care with new doctor           Updated Health Maintenance information She declines cancer screening today Request copy of results from [facility] Encouraged improvement to lifestyle with diet and exercise Goal of weight loss  Hypothyroidism Due for labs, check at next visit 4-6 weeks Request records Keep on current dose Levothyroxine 100mcg daily, can refill if need sooner  OA/DJD Knees Chronic problem Discussion briefly today on conservative management Trial on Voltaren topical Keep Tylenol / NSAID PRN and OTC advice Future consider further management imaging / rx / injection / PT / referral  GERD Continue PPi therapy, refill if need  2nd dose Shingrix, printed get at pharmacy  Meds ordered this encounter  Medications   SHINGRIX injection    Sig: Inject 0.5 mL into muscle for shingles vaccine. Final dose.    Dispense:  0.5 mL    Refill:  0      Follow up plan: Return in about 1 month (around  12/16/2021) for 4-6 weeks fasting lab only then 1 week later Annual Physical.  Future TSH T4 A1c Labs 4-6 weeks.  Saralyn PilarAlexander Kirsty Monjaraz, DO [facility] 11/18/2021, 10:21 AM

## 2021-12-22 ENCOUNTER — Other Ambulatory Visit: Payer: Self-pay

## 2021-12-22 DIAGNOSIS — E7800 Pure hypercholesterolemia, unspecified: Secondary | ICD-10-CM

## 2021-12-22 DIAGNOSIS — Z0000 Encounter for general adult medical examination without abnormal findings: Secondary | ICD-10-CM

## 2021-12-22 DIAGNOSIS — K219 Gastro-esophageal reflux disease without esophagitis: Secondary | ICD-10-CM

## 2021-12-22 DIAGNOSIS — E039 Hypothyroidism, unspecified: Secondary | ICD-10-CM

## 2021-12-22 DIAGNOSIS — M170 Bilateral primary osteoarthritis of knee: Secondary | ICD-10-CM

## 2021-12-22 DIAGNOSIS — R7303 Prediabetes: Secondary | ICD-10-CM

## 2021-12-23 ENCOUNTER — Other Ambulatory Visit: Payer: Medicare Other

## 2021-12-23 ENCOUNTER — Other Ambulatory Visit: Payer: Self-pay

## 2021-12-23 DIAGNOSIS — R7303 Prediabetes: Secondary | ICD-10-CM | POA: Diagnosis not present

## 2021-12-23 DIAGNOSIS — M170 Bilateral primary osteoarthritis of knee: Secondary | ICD-10-CM | POA: Diagnosis not present

## 2021-12-23 DIAGNOSIS — K219 Gastro-esophageal reflux disease without esophagitis: Secondary | ICD-10-CM | POA: Diagnosis not present

## 2021-12-23 DIAGNOSIS — E7800 Pure hypercholesterolemia, unspecified: Secondary | ICD-10-CM | POA: Diagnosis not present

## 2021-12-23 DIAGNOSIS — E039 Hypothyroidism, unspecified: Secondary | ICD-10-CM | POA: Diagnosis not present

## 2021-12-24 LAB — COMPLETE METABOLIC PANEL WITH GFR
AG Ratio: 2.3 (calc) (ref 1.0–2.5)
ALT: 132 U/L — ABNORMAL HIGH (ref 6–29)
AST: 96 U/L — ABNORMAL HIGH (ref 10–35)
Albumin: 4.8 g/dL (ref 3.6–5.1)
Alkaline phosphatase (APISO): 80 U/L (ref 37–153)
BUN: 15 mg/dL (ref 7–25)
CO2: 27 mmol/L (ref 20–32)
Calcium: 9.6 mg/dL (ref 8.6–10.4)
Chloride: 101 mmol/L (ref 98–110)
Creat: 0.75 mg/dL (ref 0.60–1.00)
Globulin: 2.1 g/dL (calc) (ref 1.9–3.7)
Glucose, Bld: 106 mg/dL — ABNORMAL HIGH (ref 65–99)
Potassium: 4.1 mmol/L (ref 3.5–5.3)
Sodium: 139 mmol/L (ref 135–146)
Total Bilirubin: 0.7 mg/dL (ref 0.2–1.2)
Total Protein: 6.9 g/dL (ref 6.1–8.1)
eGFR: 85 mL/min/{1.73_m2} (ref >=60)

## 2021-12-24 LAB — CBC WITH DIFFERENTIAL/PLATELET
Absolute Monocytes: 456 cells/uL (ref 200–950)
Basophils Absolute: 28 cells/uL (ref 0–200)
Basophils Relative: 0.6 %
Eosinophils Absolute: 61 cells/uL (ref 15–500)
Eosinophils Relative: 1.3 %
HCT: 41.2 % (ref 35.0–45.0)
Hemoglobin: 13.7 g/dL (ref 11.7–15.5)
Lymphs Abs: 2181 cells/uL (ref 850–3900)
MCH: 29.8 pg (ref 27.0–33.0)
MCHC: 33.3 g/dL (ref 32.0–36.0)
MCV: 89.6 fL (ref 80.0–100.0)
MPV: 8.9 fL (ref 7.5–12.5)
Monocytes Relative: 9.7 %
Neutro Abs: 1974 cells/uL (ref 1500–7800)
Neutrophils Relative %: 42 %
Platelets: 275 10*3/uL (ref 140–400)
RBC: 4.6 10*6/uL (ref 3.80–5.10)
RDW: 13.3 % (ref 11.0–15.0)
Total Lymphocyte: 46.4 %
WBC: 4.7 10*3/uL (ref 3.8–10.8)

## 2021-12-24 LAB — LIPID PANEL
Cholesterol: 229 mg/dL — ABNORMAL HIGH (ref <200)
HDL: 54 mg/dL (ref >=50)
LDL Cholesterol (Calc): 148 mg/dL (calc) — ABNORMAL HIGH
Non-HDL Cholesterol (Calc): 175 mg/dL (calc) — ABNORMAL HIGH (ref <130)
Total CHOL/HDL Ratio: 4.2 (calc) (ref <5.0)
Triglycerides: 147 mg/dL (ref <150)

## 2021-12-24 LAB — T4, FREE: Free T4: 1.2 ng/dL (ref 0.8–1.8)

## 2021-12-24 LAB — HEMOGLOBIN A1C
Hgb A1c MFr Bld: 6.1 % of total Hgb — ABNORMAL HIGH (ref <5.7)
Mean Plasma Glucose: 128 mg/dL
eAG (mmol/L): 7.1 mmol/L

## 2021-12-24 LAB — TSH: TSH: 2.18 mIU/L (ref 0.40–4.50)

## 2021-12-30 ENCOUNTER — Ambulatory Visit (INDEPENDENT_AMBULATORY_CARE_PROVIDER_SITE_OTHER): Payer: Medicare Other | Admitting: Family Medicine

## 2021-12-30 ENCOUNTER — Other Ambulatory Visit: Payer: Self-pay

## 2021-12-30 ENCOUNTER — Encounter: Payer: Self-pay | Admitting: Family Medicine

## 2021-12-30 VITALS — BP 134/86 | HR 70 | Ht 65.0 in | Wt 165.0 lb

## 2021-12-30 DIAGNOSIS — R7303 Prediabetes: Secondary | ICD-10-CM

## 2021-12-30 DIAGNOSIS — R7989 Other specified abnormal findings of blood chemistry: Secondary | ICD-10-CM | POA: Diagnosis not present

## 2021-12-30 DIAGNOSIS — E7800 Pure hypercholesterolemia, unspecified: Secondary | ICD-10-CM

## 2021-12-30 DIAGNOSIS — Z1231 Encounter for screening mammogram for malignant neoplasm of breast: Secondary | ICD-10-CM | POA: Diagnosis not present

## 2021-12-30 DIAGNOSIS — E039 Hypothyroidism, unspecified: Secondary | ICD-10-CM | POA: Diagnosis not present

## 2021-12-30 DIAGNOSIS — Z0000 Encounter for general adult medical examination without abnormal findings: Secondary | ICD-10-CM | POA: Diagnosis not present

## 2021-12-30 NOTE — Patient Instructions (Addendum)
Thank you for coming to the office today. ? ?For Mammogram screening for breast cancer  ? ?Call the Imaging Center below anytime to schedule your own appointment now that order has been placed. ? ?[facility] ?[facility] ?[address] ?[place], [place] [address] ?Phone: [phone] ? ?------------------------------------------- ? ?Alto Denver RN will call you with ? ?1. You have symptoms of Vertigo (Benign Paroxysmal Positional Vertigo) ?- This is commonly caused by inner ear fluid imbalance, sometimes can be worsened by allergies and sinus symptoms, otherwise it can occur randomly sometimes and we may never discover the exact cause. ?- To treat this, try the Epley Manuever (see diagrams/instructions below) at home up to 3 times a day for 1-2 weeks or until symptoms resolve ? ?If you develop significant worsening episode with vertigo that does not improve and you get severe headache, loss of vision, arm or leg weakness, slurred speech, or other concerning symptoms please seek immediate medical attention at Emergency Department. ? ?See the next page for images describing the Epley Manuever. ? ? ? ? ?---------------------------------------------------------------------------------------------------------------------- ? ? ? ? ? ? ? ? ? ?Please schedule a Follow-up Appointment to: Return in about 6 months (around 07/02/2022) for 6 month fasting lab then return 1 week later Follow-up Cholesterol, PreDM, Circulation. ? ?If you have any other questions or concerns, please feel free to call the office or send a message through MyChart. You may also schedule an earlier appointment if necessary. ? ?Additionally, you may be receiving a survey about your experience at our office within a few days to 1 week by e-mail or mail. We value your feedback. ? ?Saralyn Pilar, DO ?[facility], [place] ?

## 2021-12-30 NOTE — Progress Notes (Signed)
? ?Subjective:  ? ? Patient ID: Stacy Schmidt, female    DOB: May 13, 1951, 71 y.o.   MRN: [id number] ? ?Stacy Schmidt is a 71 y.o. female presenting on 12/30/2021 for Annual Exam ? ? ?HPI ? ?Here for Annual Physical and Lab Review. ? ?She had screening from [facility] ? ?LDCT ?Was told had pulm nodule, at [facility], she does not have the report. ? ?Elevated LFTs ?AST / ALT both elevated 96 / 132. ?Admits poor diet. And not following diet recommendations. ? ?Hypothyroidism ?Taking Levothyroxine 112 mcg daily ?  ?Pre-Diabetes ?A1c 6.1 last result. ?Weight was down to 140s, but gained back to 160s ?  ?Osteoarthritis Knees ?She is doing stationary bike exercises. She has been evaluated and they told her it was no problem. She has tried topical medication. ?She has limitation with prolonged walking can cause worsening soreness in knees. ?  ?GERD ?Chronic problem, on PPI Omeprazole 40mg  daily. ? ?BPPV ?Cerumen ?Reports occasional brief dizziness spells with quick turning. ? ?Varicose veins ? ?  ?Health Maintenance: ?UTD Flu Shot. ?  ?Shingrix last dose 12/16/20, need 2nd dose. ?  ?Declines Mammogram screening, last done 5-10 years. Interested in mammogram now. ?  ?[place] done in [place] in past 2 years, she was advised to return in 3-4 years. She agrees to [facility] in 1-2 years. ? ? ?[facility] 12/30/2021 11/18/2021  ?Decreased Interest 0 0  ?Down, Depressed, Hopeless 0 0  ?PHQ - 2 Score 0 0  ?Altered sleeping 0 -  ?Tired, decreased energy 3 -  ?Change in appetite 0 -  ?Feeling bad or failure about yourself  0 -  ?Trouble concentrating 0 -  ?Moving slowly or fidgety/restless 0 -  ?Suicidal thoughts 0 -  ?PHQ-9 Score 3 -  ?Difficult doing work/chores Not difficult at all -  ? ?GAD 7 : Generalized Anxiety Score 12/30/2021  ?Nervous, Anxious, on Edge 0  ?Control/stop worrying 0  ?Worry too much - different things 0  ?Trouble relaxing 0  ?Restless 0  ?Easily annoyed or irritable 0  ?Afraid - awful  might happen 0  ?Total GAD 7 Score 0  ?Anxiety Difficulty Not difficult at all  ? ? ? ?Past Medical History:  ?Diagnosis Date  ? Diverticulitis   ? GERD (gastroesophageal reflux disease)   ? Hypothyroidism   ? ?History reviewed. No pertinent surgical history. ?Social History  ? ?Socioeconomic History  ? Marital status: Single  ?  Spouse name: Not on file  ? Number of children: Not on file  ? Years of education: Not on file  ? Highest education level: Not on file  ?Occupational History  ? Not on file  ?Tobacco Use  ? Smoking status: Former  ?  Packs/day: 1.00  ?  Years: 50.00  ?  Pack years: 50.00  ?  Types: Cigarettes  ? Smokeless tobacco: Never  ?Vaping Use  ? Vaping Use: Never used  ?Substance and Sexual Activity  ? Alcohol use: Not Currently  ? Drug use: Not on file  ? Sexual activity: Not on file  ?Other Topics Concern  ? Not on file  ?Social History Narrative  ? Not on file  ? ?Social Determinants of Health  ? ?Financial Resource Strain: Not on file  ?Food Insecurity: Not on file  ?Transportation Needs: Not on file  ?Physical Activity: Not on file  ?Stress: Not on file  ?Social Connections: Not on file  ?Intimate Partner Violence: Not on file  ? ?  Family History  ?Problem Relation Age of Onset  ? Hypertension Mother   ? Congestive Heart Failure Mother   ? Hypertension Father   ? Stroke Father   ? Cancer Sister   ? Kidney disease Sister   ? Kidney cancer Sister 23  ? Breast cancer Sister   ? Cancer Brother   ? Kidney disease Brother   ? Aneurysm Brother   ? Lung cancer Brother   ? ?Current Outpatient Medications on File Prior to Visit  ?Medication Sig  ? Cholecalciferol (VITAMIN D-3) 125 MCG (5000 UT) TABS Take by mouth.  ? fish oil-omega-3 fatty acids 1000 MG capsule Take 2 g by mouth daily.  ? levothyroxine (SYNTHROID) 100 MCG tablet Take 1 tablet (100 mcg total) by mouth daily before breakfast.  ? omeprazole (PRILOSEC) 40 MG capsule Take 40 mg by mouth daily.  ? SHINGRIX injection Inject 0.5 mL into muscle for  shingles vaccine. Final dose.  ? ?No current facility-administered medications on file prior to visit.  ? ? ?Review of Systems  ?Constitutional:  Negative for activity change, appetite change, chills, diaphoresis, fatigue and fever.  ?HENT:  Negative for congestion and hearing loss.   ?Eyes:  Negative for visual disturbance.  ?Respiratory:  Negative for cough, chest tightness, shortness of breath and wheezing.   ?Cardiovascular:  Negative for chest pain, palpitations and leg swelling.  ?Gastrointestinal:  Negative for abdominal pain, constipation, diarrhea, nausea and vomiting.  ?Genitourinary:  Negative for dysuria, frequency and hematuria.  ?Musculoskeletal:  Negative for arthralgias and neck pain.  ?Skin:  Negative for rash.  ?Neurological:  Negative for dizziness, weakness, light-headedness, numbness and headaches.  ?Hematological:  Negative for adenopathy.  ?Psychiatric/Behavioral:  Negative for behavioral problems, dysphoric mood and sleep disturbance.   ?Per HPI unless specifically indicated above ? ?   ?Objective:  ?  ?BP 134/86   Pulse 70   Ht 5\' 5"  (1.651 m)   Wt 165 lb (74.8 kg)   SpO2 99%   BMI 27.46 kg/m?   ?Wt Readings from Last 3 Encounters:  ?12/30/21 165 lb (74.8 kg)  ?11/18/21 164 lb 9.6 oz (74.7 kg)  ?06/06/20 144 lb 13.5 oz (65.7 kg)  ?  ?Physical Exam ?Vitals and nursing note reviewed.  ?Constitutional:   ?   General: She is not in acute distress. ?   Appearance: She is well-developed. She is not diaphoretic.  ?   Comments: Well-appearing, comfortable, cooperative  ?HENT:  ?   Head: Normocephalic and atraumatic.  ?   Right Ear: Tympanic membrane, ear canal and external ear normal. There is no impacted cerumen.  ?   Left Ear: Tympanic membrane, ear canal and external ear normal. There is no impacted cerumen.  ?   Ears:  ?   Comments: Some cerumen bilateral ?Eyes:  ?   General:     ?   Right eye: No discharge.     ?   Left eye: No discharge.  ?   Conjunctiva/sclera: Conjunctivae normal.  ?    Pupils: Pupils are equal, round, and reactive to light.  ?Neck:  ?   Thyroid: No thyromegaly.  ?   Vascular: No carotid bruit.  ?Cardiovascular:  ?   Rate and Rhythm: Normal rate and regular rhythm.  ?   Pulses: Normal pulses.  ?   Heart sounds: Normal heart sounds. No murmur heard. ?Pulmonary:  ?   Effort: Pulmonary effort is normal. No respiratory distress.  ?   Breath sounds: Normal breath sounds.  No wheezing or rales.  ?Abdominal:  ?   General: Bowel sounds are normal. There is no distension.  ?   Palpations: Abdomen is soft. There is no mass.  ?   Tenderness: There is no abdominal tenderness.  ?Musculoskeletal:     ?   General: No tenderness. Normal range of motion.  ?   Cervical back: Normal range of motion and neck supple.  ?   Comments: Upper / Lower Extremities: ?- Normal muscle tone, strength bilateral upper extremities 5/5, lower extremities 5/5  ?Lymphadenopathy:  ?   Cervical: No cervical adenopathy.  ?Skin: ?   General: Skin is warm and dry.  ?   Findings: No erythema or rash.  ?   Comments: Varicose veins  ?Neurological:  ?   Mental Status: She is alert and oriented to person, place, and time.  ?   Comments: Distal sensation intact to light touch all extremities  ?Psychiatric:     ?   Mood and Affect: Mood normal.     ?   Behavior: Behavior normal.     ?   Thought Content: Thought content normal.  ?   Comments: Well groomed, good eye contact, normal speech and thoughts  ? ? ? ?Results for orders placed or performed in visit on 12/22/21  ?T4, free  ?Result Value Ref Range  ? Free T4 1.2 0.8 - 1.8 ng/dL  ?TSH  ?Result Value Ref Range  ? TSH 2.18 0.40 - 4.50 mIU/L  ?Hemoglobin A1c  ?Result Value Ref Range  ? Hgb A1c MFr Bld 6.1 (H) <5.7 % of total Hgb  ? Mean Plasma Glucose 128 mg/dL  ? eAG (mmol/L) 7.1 mmol/L  ?CBC with Differential/Platelet  ?Result Value Ref Range  ? WBC 4.7 3.8 - 10.8 Thousand/uL  ? RBC 4.60 3.80 - 5.10 Million/uL  ? Hemoglobin 13.7 11.7 - 15.5 g/dL  ? HCT 41.2 35.0 - 45.0 %  ? MCV  89.6 80.0 - 100.0 fL  ? MCH 29.8 27.0 - 33.0 pg  ? MCHC 33.3 32.0 - 36.0 g/dL  ? RDW 13.3 11.0 - 15.0 %  ? Platelets 275 140 - 400 Thousand/uL  ? MPV 8.9 7.5 - 12.5 fL  ? Neutro Abs 1,974 1,500 - 7,800 cells/

## 2021-12-31 ENCOUNTER — Other Ambulatory Visit: Payer: Self-pay | Admitting: Family Medicine

## 2021-12-31 MED ORDER — LEVOTHYROXINE SODIUM 100 MCG PO TABS: 100.0000 ug | ORAL_TABLET | Freq: Every day | ORAL | 3 refills | Status: DC

## 2021-12-31 MED ORDER — OMEPRAZOLE 40 MG PO CPDR: 40.0000 mg | DELAYED_RELEASE_CAPSULE | Freq: Every day | ORAL | 3 refills | Status: DC

## 2021-12-31 NOTE — Telephone Encounter (Signed)
Patient called to answer her questions about mail order pharmacy. She says she's on the website of her insurance provider and sees [facility]. I advised to call and speak to someone with the insurance and ask them about the mail order, if there is low cost to her and which is best to use. Advised we will refill the medications requested to [facility] for now and she can call back to add the mail order if she decides on that. Patient verbalized understanding. ?

## 2021-12-31 NOTE — Telephone Encounter (Signed)
Pt needs a call back from the clinic, she says she discussed a mail delivery pharmacy with someone at the office and wants to discuss this again with someone in the clinic.  ? ? ?Medication Refill - Medication: levothyroxine (SYNTHROID) 100 MCG tablet ? ?omeprazole (PRILOSEC) 40 MG capsule ? ?Has the patient contacted their pharmacy? Yes.   ?(Agent: If no, request that the patient contact the pharmacy for the refill. If patient does not wish to contact the pharmacy document the reason why and proceed with request.) ?(Agent: If yes, when and what did the pharmacy advise?) ? ?Preferred Pharmacy (with phone number or street name):  ?[id number] - [place] ([place]), [place] - [address]  ?[address] [place] [address]  ?Phone: [phone] Fax: [phone]  ? ?Has the patient been seen for an appointment in the last year OR does the patient have an upcoming appointment? Yes.   ? ?Agent: Please be advised that RX refills may take up to 3 business days. We ask that you follow-up with your pharmacy. ? ?

## 2021-12-31 NOTE — Telephone Encounter (Signed)
Requested medication (s) are due for refill today: yes ? ?Requested medication (s) are on the active medication list: yes ? ?Last refill:   ? ?Future visit scheduled: yes ? ?Notes to clinic:  both medications are listed as historical. Please advise ? ? ?  ?Requested Prescriptions  ?Pending Prescriptions Disp Refills  ? levothyroxine (SYNTHROID) 100 MCG tablet 90 tablet 3  ?  Sig: Take 1 tablet (100 mcg total) by mouth daily before breakfast.  ?  ? Endocrinology:  Hypothyroid Agents Passed - 12/31/2021 11:56 AM  ?  ?  Passed - TSH in normal range and within 360 days  ?  TSH  ?Date Value Ref Range Status  ?12/23/2021 2.18 0.40 - 4.50 mIU/L Final  ?  ?  ?  ?  Passed - Valid encounter within last 12 months  ?  Recent Outpatient Visits   ? ?      ? Yesterday Annual physical exam  ? [facility] [name], DO  ? 1 month ago Acquired hypothyroidism  ? [facility] [name], DO  ? ?  ?  ?Future Appointments   ? ?        ? In 6 months Althea Charon, Netta Neat, DO [facility]  ? ?  ? ?  ?  ?  ? omeprazole (PRILOSEC) 40 MG capsule    ?  Sig: Take 1 capsule (40 mg total) by mouth daily.  ?  ? Gastroenterology: Proton Pump Inhibitors Passed - 12/31/2021 11:56 AM  ?  ?  Passed - Valid encounter within last 12 months  ?  Recent Outpatient Visits   ? ?      ? Yesterday Annual physical exam  ? [facility] [name], DO  ? 1 month ago Acquired hypothyroidism  ? [facility] [name], DO  ? ?  ?  ?Future Appointments   ? ?        ? In 6 months Althea Charon, Netta Neat, DO [facility]  ? ?  ? ?  ?  ?  ? ?

## 2022-01-04 ENCOUNTER — Telehealth: Payer: Self-pay

## 2022-01-04 ENCOUNTER — Other Ambulatory Visit: Payer: Self-pay

## 2022-01-04 ENCOUNTER — Ambulatory Visit (INDEPENDENT_AMBULATORY_CARE_PROVIDER_SITE_OTHER): Payer: Medicare Other

## 2022-01-04 DIAGNOSIS — Z0000 Encounter for general adult medical examination without abnormal findings: Secondary | ICD-10-CM | POA: Diagnosis not present

## 2022-01-04 NOTE — Telephone Encounter (Signed)
Her lab results were reviewed directly with her at her physical apt on 12/30/21. I reviewed them with her at that time. ? ?They are also available in mychart. ? ?Saralyn Pilar, DO ?[facility] ?[facility] ?01/04/2022, 3:28 PM ? ?

## 2022-01-04 NOTE — Telephone Encounter (Signed)
The pt had a telephone AWV done today and requested her lab results for 12/30/2021. Please advise  ?

## 2022-01-04 NOTE — Progress Notes (Signed)
?Virtual Visit via Telephone Note ? ?I connected with Stacy Schmidt on 01/04/22 at  2:20 PM EDT by telephone and verified that I am speaking with the correct person using two identifiers. ? ?Location: ?Patient: Home  ?Provider: [facility]  ?  ?I discussed the limitations, risks, security and privacy concerns of performing an evaluation and management service by telephone and the availability of in person appointments. I also discussed with the patient that there may be a patient responsible charge related to this service. The patient expressed understanding and agreed to proceed. ? ? ?  ?I discussed the assessment and treatment plan with the patient. The patient was provided an opportunity to ask questions and all were answered. The patient agreed with the plan and demonstrated an understanding of the instructions. ?  ?The patient was advised to call back or seek an in-person evaluation if the symptoms worsen or if the condition fails to improve as anticipated. ? ?I provided 30 minutes of non-face-to-face time during this encounter. ? ? ?Wilson Singer, CMA  ? ?Subjective:  ? ? Stacy Schmidt is a 70 y.o. female who presents for Medicare Annual/Subsequent preventive examination. ? ?Preventive Screening-Counseling & Management ? ?Tobacco ?Social History  ? ?Tobacco Use  ?Smoking Status Former  ? Packs/day: 1.00  ? Years: 50.00  ? Pack years: 50.00  ? Types: Cigarettes  ?Smokeless Tobacco Never  ?  ? ? ? ?Current Problems (verified) ?Patient Active Problem List  ? Diagnosis Date Noted  ? Pure hypercholesterolemia 12/30/2021  ? Acquired hypothyroidism 11/18/2021  ? Pre-diabetes 11/18/2021  ? Gastroesophageal reflux disease without esophagitis 11/18/2021  ? Primary osteoarthritis of both knees 11/18/2021  ? Actinic keratoses 11/18/2021  ? Syncope and collapse 06/05/2020  ? Nicotine dependence 06/05/2020  ?Dexa Scan: 11/02/2014  ? ?Medications Prior to Visit ?Current Outpatient Medications on  File Prior to Visit  ?Medication Sig Dispense Refill  ? Cholecalciferol (VITAMIN D-3) 125 MCG (5000 UT) TABS Take by mouth.    ? fish oil-omega-3 fatty acids 1000 MG capsule Take 2 g by mouth daily.    ? levothyroxine (SYNTHROID) 100 MCG tablet Take 1 tablet (100 mcg total) by mouth daily before breakfast. 90 tablet 3  ? omeprazole (PRILOSEC) 40 MG capsule Take 1 capsule (40 mg total) by mouth daily before breakfast. 90 capsule 3  ? ?No current facility-administered medications on file prior to visit.  ? ? ?Current Medications (verified) ?Current Outpatient Medications  ?Medication Sig Dispense Refill  ? Cholecalciferol (VITAMIN D-3) 125 MCG (5000 UT) TABS Take by mouth.    ? fish oil-omega-3 fatty acids 1000 MG capsule Take 2 g by mouth daily.    ? levothyroxine (SYNTHROID) 100 MCG tablet Take 1 tablet (100 mcg total) by mouth daily before breakfast. 90 tablet 3  ? omeprazole (PRILOSEC) 40 MG capsule Take 1 capsule (40 mg total) by mouth daily before breakfast. 90 capsule 3  ? ?No current facility-administered medications for this visit.  ?  ? ?Allergies (verified) ?Patient has no known allergies.  ? ?PAST HISTORY ? ?Family History ?Family History  ?Problem Relation Age of Onset  ? Hypertension Mother   ? Congestive Heart Failure Mother   ? Hypertension Father   ? Stroke Father   ? Cancer Sister   ? Kidney disease Sister   ? Kidney cancer Sister 52  ? Breast cancer Sister   ? Cancer Brother   ? Kidney disease Brother   ? Aneurysm Brother   ?  Lung cancer Brother   ? ? ?Social History ?Social History  ? ?Tobacco Use  ? Smoking status: Former  ?  Packs/day: 1.00  ?  Years: 50.00  ?  Pack years: 50.00  ?  Types: Cigarettes  ? Smokeless tobacco: Never  ?Substance Use Topics  ? Alcohol use: Not Currently  ?  ?Social History  ? ?Socioeconomic History  ? Marital status: Single  ?  Spouse name: Not on file  ? Number of children: 1  ? Years of education: Not on file  ? Highest education level: Not on file  ?Occupational  History  ? Occupation: retired  ?Tobacco Use  ? Smoking status: Former  ?  Packs/day: 1.00  ?  Years: 50.00  ?  Pack years: 50.00  ?  Types: Cigarettes  ? Smokeless tobacco: Never  ?Vaping Use  ? Vaping Use: Never used  ?Substance and Sexual Activity  ? Alcohol use: Not Currently  ? Drug use: Not on file  ? Sexual activity: Not on file  ?Other Topics Concern  ? Not on file  ?Social History Narrative  ? Not on file  ? ?Social Determinants of Health  ? ?Financial Resource Strain: Low Risk   ? Difficulty of Paying Living Expenses: Not hard at all  ?Food Insecurity: No Food Insecurity  ? Worried About Charity fundraiser in the Last Year: Never true  ? Ran Out of Food in the Last Year: Never true  ?Transportation Needs: No Transportation Needs  ? Lack of Transportation (Medical): No  ? Lack of Transportation (Non-Medical): No  ?Physical Activity: Sufficiently Active  ? Days of Exercise per Week: 7 days  ? Minutes of Exercise per Session: 30 min  ?Stress: No Stress Concern Present  ? Feeling of Stress : Not at all  ?Social Connections: Moderately Integrated  ? Frequency of Communication with Friends and Family: More than three times a week  ? Frequency of Social Gatherings with Friends and Family: More than three times a week  ? Attends Religious Services: 1 to 4 times per year  ? Active Member of Clubs or Organizations: No  ? Attends Archivist Meetings: 1 to 4 times per year  ? Marital Status: Divorced  ?Intimate Partner Violence: Not on file  ? ?Are there smokers in your home (other than you)? Yes ? ?Risk Factors ?Current exercise habits: Home exercise routine includes stationary bike .  ?Dietary issues discussed: Yes  ? ?Cardiac risk factors: weight, family history. ? ?Depression Screen ?(Note: if answer to either of the following is "Yes", a more complete depression screening is indicated)  ? Over the past two weeks, have you felt down, depressed or hopeless? No ? Over the past two weeks, have you felt  little interest or pleasure in doing things? No ? Have you lost interest or pleasure in daily life? No ? Do you often feel hopeless? No ? Do you cry easily over simple problems? No ? ?Activities of Daily Living ?In your present state of health, do you have any difficulty performing the following activities?:  ?Driving? No ?Managing money?  No ?Feeding yourself? No ?Getting from bed to chair? No ?Climbing a flight of stairs? Yes ?Preparing food and eating?: No ?Bathing or showering? No ?Getting dressed: No ?Getting to the toilet? No ?Using the toilet:No ?Moving around from place to place: No ?In the past year have you fallen or had a near fall?:No ? ? Are you sexually active?  No ? Do you  have more than one partner?  No ? ?Hearing Difficulties: No ?Do you often ask people to speak up or repeat themselves? No ?Do you experience ringing or noises in your ears? No ?Do you have difficulty understanding soft or whispered voices? No ? ? Do you feel that you have a problem with memory? Yes ? Do you often misplace items? Yes ? Do you feel safe at home?  Yes ? ?Cognitive Testing ? Alert? Yes  Normal  ? Oriented to person? Yes  Place? Yes  ? Time? Yes ? Recall of three objects?  Yes ? Can perform simple calculations? Yes ? Displays appropriate judgment?Yes ? Can read the correct time from a watch face?Yes ? ? ? Advanced Directives have been discussed with the patient? No ? ?List the Names of Other Physician/Practitioners you currently use: ?Dentist: Dental Works biannually ?Ophthalmologist: x 49yrs ago. Educated on the importance of annual eye exams. ? ?No hospitalizations in the past 12 mths.  ?Immunization History  ?Administered Date(s) Administered  ? Fluad Quad(high Dose 65+) 07/25/2019  ? Influenza, High Dose Seasonal PF 08/05/2017, 07/22/2020, 07/27/2021  ? Influenza-Unspecified 08/08/2018, 08/19/2019  ? Moderna Sars-Covid-2 Vaccination 12/27/2019, 01/24/2020  ? Pneumococcal Conjugate-13 06/08/2016  ? Pneumococcal  Polysaccharide-23 08/08/2018  ? Tdap 08/05/2017  ? Zoster Recombinat (Shingrix) 12/16/2020, 11/23/2021  ? ? ?Screening Tests ?Health Maintenance  ?Topic Date Due  ? Hepatitis C Screening  Never done  ? COLONOSCOPY (Pt

## 2022-01-04 NOTE — Telephone Encounter (Signed)
Stacy Schmidt has spoken with the patient and has gone over the results of her recent labs. All questions were answered.  ?

## 2022-01-05 ENCOUNTER — Telehealth: Payer: Self-pay

## 2022-01-05 NOTE — Chronic Care Management (AMB) (Signed)
?  Chronic Care Management  ? ?Outreach Note ? ?01/05/2022 ?Name: Stacy Schmidt MRN: [id number] DOB: 25-Jan-1951 ? ?Stacy Schmidt is a 71 y.o. year old female who is a primary care patient of Olin Hauser, DO. I reached out to [employer] by phone today in response to a referral sent by Stacy Schmidt's primary care provider. ? ?An unsuccessful telephone outreach was attempted today. The patient was referred to the case management team for assistance with care management and care coordination.  ? ?Follow Up Plan: A HIPAA compliant phone message was left for the patient providing contact information and requesting a return call.  ?The care management team will reach out to the patient again over the next 5 days.  ?If patient returns call to provider office, please advise to call [place] * at [phone]* ? ?Noreene Larsson, RMA ?Care Guide, Embedded Care Coordination ?[facility]  [facility] Management  ?[place], [place] [address] ?Direct Dial: [phone] ?[email or web address] ?Website: [email or web address]  ? ?

## 2022-01-14 NOTE — Chronic Care Management (AMB) (Signed)
?  Care Management  ? ?Note ? ?01/14/2022 ?Name: JANAIYA BEAUCHESNE MRN: [id number] DOB: July 14, 1951 ? ?Vinetta A Somma is a 71 y.o. year old female who is a primary care patient of [name], DO. I reached out to [employer] by phone today offer care coordination services.  ? ?Ms. Seckel was given information about care management services today including:  ?Care management services include personalized support from designated clinical staff supervised by her physician, including individualized plan of care and coordination with other care providers ?24/7 contact phone numbers for assistance for urgent and routine care needs. ?The patient may stop care management services at any time by phone call to the office staff. ? ?Patient agreed to services and verbal consent obtained.  ? ?Follow up plan: ?Telephone appointment with care management team member scheduled for: 01/28/2022 ? ?Penne Lash, RMA ?Care Guide, Embedded Care Coordination ?[facility]  Care Management  ?[place], [place] [address] ?Direct Dial: [phone] ?[email or web address] ?Website: [email or web address]   ?

## 2022-01-26 ENCOUNTER — Ambulatory Visit: Payer: Self-pay | Admitting: Family Medicine

## 2022-01-28 ENCOUNTER — Ambulatory Visit: Payer: Self-pay

## 2022-01-28 ENCOUNTER — Telehealth: Payer: Medicare Other

## 2022-01-28 DIAGNOSIS — R7303 Prediabetes: Secondary | ICD-10-CM

## 2022-01-28 DIAGNOSIS — E039 Hypothyroidism, unspecified: Secondary | ICD-10-CM

## 2022-01-28 DIAGNOSIS — M170 Bilateral primary osteoarthritis of knee: Secondary | ICD-10-CM

## 2022-01-28 DIAGNOSIS — E7800 Pure hypercholesterolemia, unspecified: Secondary | ICD-10-CM

## 2022-01-28 NOTE — Chronic Care Management (AMB) (Signed)
? Care Management ?  ? RN Visit Note ? ?01/28/2022 ?Name: Stacy Schmidt MRN: [id number] DOB: Apr 09, 1951 ? ?Subjective: ?Stacy Schmidt is a 71 y.o. year old female who is a primary care patient of [name], DO. The care management team was consulted for assistance with disease management and care coordination needs.   ? ?Engaged with patient by telephone for initial visit in response to provider referral for case management and/or care coordination services.  ? ?Consent to Services:  ? Ms. Langsam was given information about Care Management services today including:  ?Care Management services includes personalized support from designated clinical staff supervised by her physician, including individualized plan of care and coordination with other care providers ?24/7 contact phone numbers for assistance for urgent and routine care needs. ?The patient may stop case management services at any time by phone call to the office staff. ? ?Patient agreed to services and consent obtained.  ? ?Assessment: Review of patient past medical history, allergies, medications, health status, including review of consultants reports, laboratory and other test data, was performed as part of comprehensive evaluation and provision of chronic care management services.  ? ?SDOH (Social Determinants of Health) assessments and interventions performed:  ?SDOH Interventions   ? ?Flowsheet Row Most Recent Value  ?SDOH Interventions   ?Intimate Partner Violence Interventions Intervention Not Indicated  ? ?  ?  ? ?Care Plan ? ?No Known Allergies ? ?Outpatient Encounter Medications as of 01/28/2022  ?Medication Sig  ? Diclofenac Sodium (VOLTAREN EX) Apply topically.  ? Cholecalciferol (VITAMIN D-3) 125 MCG (5000 UT) TABS Take by mouth.  ? fish oil-omega-3 fatty acids 1000 MG capsule Take 2 g by mouth daily.  ? levothyroxine (SYNTHROID) 100 MCG tablet Take 1 tablet (100 mcg total) by mouth daily before breakfast.  ? omeprazole  (PRILOSEC) 40 MG capsule Take 1 capsule (40 mg total) by mouth daily before breakfast.  ? ?No facility-administered encounter medications on file as of 01/28/2022.  ? ? ?Patient Active Problem List  ? Diagnosis Date Noted  ? Pure hypercholesterolemia 12/30/2021  ? Acquired hypothyroidism 11/18/2021  ? Pre-diabetes 11/18/2021  ? Gastroesophageal reflux disease without esophagitis 11/18/2021  ? Primary osteoarthritis of both knees 11/18/2021  ? Actinic keratoses 11/18/2021  ? Syncope and collapse 06/05/2020  ? Nicotine dependence 06/05/2020  ? ? ?Conditions to be addressed/monitored: HLD, DMII, Hypothyroidism, and chronic pain ? ?Care Plan : RNCM: General Plan of Care (Adult) for Chronic Disease Management and Care Coordination Needs  ?Updates made by Marlowe Sax, RN since 01/28/2022 12:00 AM  ?  ? ?Problem: RNCM: Development of Plan of Care for Chronic Disease Management (Pre- DM, HLD, hypothyroidism, Pain)   ?Priority: High  ?  ? ?Long-Range Goal: RNCM: Effective Management  of Plan of Care for Chronic Disease Management (Pre- DM, HLD, hypothyroidism, Pain)   ?Start Date: 01/28/2022  ?Expected End Date: 01/29/2023  ?Priority: High  ?Note:   ?Current Barriers:  ?Knowledge Deficits related to plan of care for management of HLD, DMII, Chronic Pain, and Hypothyroidism  ?Chronic Disease Management support and education needs related to HLD, DMII, Chronic Pain, and hyprothyroidism ? ?RNCM Clinical Goal(s):  ?Patient will verbalize basic understanding of HLD, DMII, Hypothyroidism, and Chronic pain  disease process and self health management plan as evidenced by keeping appointments, following the plan of care, taking medications as directed, and working with the CCM team to effectively manage chronic conditions. ?take all medications exactly as prescribed and will call provider for  medication related questions as evidenced by compliance with medications and calling for refills before running out of medications    ?attend  all scheduled medical appointments: pcp and specialist on a routine basis as evidenced by compliance with appointments and calling for schedule change needs.        ?demonstrate improved and ongoing adherence to prescribed treatment plan for HLD, DMII, Hypothyroidism, and Chronic pain  as evidenced by VS stable, routine lab work, calling the office for changes or questions in chronic conditions and working with the CCM team and providers to optimize health and well being ?demonstrate ongoing self health care management ability for effective management of chronic conditions as evidenced by working with the CCM team through collaboration with Medical illustrator, provider, and care team.  ? ?Interventions: ?1:1 collaboration with primary care provider regarding development and update of comprehensive plan of care as evidenced by provider attestation and co-signature ?Inter-disciplinary care team collaboration (see longitudinal plan of care) ?Evaluation of current treatment plan related to  self management and patient's adherence to plan as established by provider ? ? ?Diabetes:  (Status: New goal. Goal on Track (progressing): YES.) Long Term Goal  ? ?Lab Results  ?Component Value Date  ? HGBA1C 6.1 (H) 12/23/2021  ? @ ?Assessed patient's understanding of A1c goal: <7% ?Provided education to patient about basic DM disease process; ?Reviewed medications with patient and discussed importance of medication adherence. 01-28-2022: The patient does not take any medications for DM. Is mindful of dietary restrictions. Will continue to monitor for changes. ;        ?Reviewed prescribed diet with patient heart healthy/ADA. 01-28-2022: Will send healthy eating information by the EMMI system, my chart, EMAIL, and mail to help with effective management of pre-DM; ?Counseled on importance of regular laboratory monitoring as prescribed. 01-28-2022: Reviewed the importance of regular lab work to monitor for changes in DM health and wellness;         ?Discussed plans with patient for ongoing care management follow up and provided patient with direct contact information for care management team;      ?Provided patient with written educational materials related to hypo and hyperglycemia and importance of correct treatment;       ?Reviewed scheduled/upcoming provider appointments including: September 2023 with the pcp;         ?call provider for findings outside established parameters;       ?Review of patient status, including review of consultants reports, relevant laboratory and other test results, and medications completed;       ?Advised patient to discuss changes in health related to DM  with provider;      ?Screening for signs and symptoms of depression related to chronic disease state;        ?Assessed social determinant of health barriers;        ? ?hypothyroidism  (Status: New goal. Goal on Track (progressing): YES.) Long Term Goal  ?Evaluation of current treatment plan related to Hypothyroidism,  self-management and patient's adherence to plan as established by provider. ?Discussed plans with patient for ongoing care management follow up and provided patient with direct contact information for care management team ?Advised patient to call the office for changes in her overall health and if she is noticing being more fatigued or tired; ?Provided education to patient re: hypothyroidism sent in the my Chart system and [employer] platform; ?Reviewed medications with patient and discussed compliance; ?Reviewed scheduled/upcoming provider appointments including 06-2022 with pcp; ?Discussed plans with patient  for ongoing care management follow up and provided patient with direct contact information for care management team; ?Advised patient to discuss questions or concerns about thyroid health and wellness with provider; ? ?Hyperlipidemia:  (Status: New goal. Goal on Track (progressing): YES.) Long Term Goal  ?Lab Results  ?Component Value Date  ? CHOL 229 (H)  12/23/2021  ? HDL 54 12/23/2021  ? LDLCALC 148 (H) 12/23/2021  ? TRIG 147 12/23/2021  ? CHOLHDL 4.2 12/23/2021  ?  ? ?Medication review performed; medication list updated in electronic medical record.

## 2022-01-28 NOTE — Patient Instructions (Signed)
Visit Information ? ?Thank you for taking time to visit with me today. Please don't hesitate to contact me if I can be of assistance to you before our next scheduled telephone appointment. ? ?Following are the goals we discussed today:  ?(Copy and paste patient goals from clinical care plan here) ? ?Our next appointment is by telephone on 03-25-2022 at 1 pm ? ?Please call the care guide team at [phone] if you need to cancel or reschedule your appointment.  ? ?If you are experiencing a Mental Health or Behavioral Health Crisis or need someone to talk to, please call the Suicide and Crisis Lifeline: 988 ?call the [place] National Suicide Prevention Lifeline: (559)153-5040 or TTY: 731-523-6600 TTY 201-715-8823) to talk to a trained counselor ?call 1-800-273-TALK (toll free, 24 hour hotline)  ? ?Following is a copy of your full plan of care:  ?Care Plan : RNCM: General Plan of Care (Adult) for Chronic Disease Management and Care Coordination Needs  ?Updates made by Marlowe Sax, RN since 01/28/2022 12:00 AM  ?  ? ?Problem: RNCM: Development of Plan of Care for Chronic Disease Management (Pre- DM, HLD, hypothyroidism, Pain)   ?Priority: High  ?  ? ?Long-Range Goal: RNCM: Effective Management  of Plan of Care for Chronic Disease Management (Pre- DM, HLD, hypothyroidism, Pain)   ?Start Date: 01/28/2022  ?Expected End Date: 01/29/2023  ?Priority: High  ?Note:   ?Current Barriers:  ?Knowledge Deficits related to plan of care for management of HLD, DMII, Chronic Pain, and Hypothyroidism  ?Chronic Disease Management support and education needs related to HLD, DMII, Chronic Pain, and hyprothyroidism ? ?RNCM Clinical Goal(s):  ?Patient will verbalize basic understanding of HLD, DMII, Hypothyroidism, and Chronic pain  disease process and self health management plan as evidenced by keeping appointments, following the plan of care, taking medications as directed, and working with the CCM team to effectively manage chronic  conditions. ?take all medications exactly as prescribed and will call provider for medication related questions as evidenced by compliance with medications and calling for refills before running out of medications    ?attend all scheduled medical appointments: pcp and specialist on a routine basis as evidenced by compliance with appointments and calling for schedule change needs.        ?demonstrate improved and ongoing adherence to prescribed treatment plan for HLD, DMII, Hypothyroidism, and Chronic pain  as evidenced by VS stable, routine lab work, calling the office for changes or questions in chronic conditions and working with the CCM team and providers to optimize health and well being ?demonstrate ongoing self health care management ability for effective management of chronic conditions as evidenced by working with the CCM team through collaboration with Medical illustrator, provider, and care team.  ? ?Interventions: ?1:1 collaboration with primary care provider regarding development and update of comprehensive plan of care as evidenced by provider attestation and co-signature ?Inter-disciplinary care team collaboration (see longitudinal plan of care) ?Evaluation of current treatment plan related to  self management and patient's adherence to plan as established by provider ? ? ?Diabetes:  (Status: New goal. Goal on Track (progressing): YES.) Long Term Goal  ? ?Lab Results  ?Component Value Date  ? HGBA1C 6.1 (H) 12/23/2021  ? @ ?Assessed patient's understanding of A1c goal: <7% ?Provided education to patient about basic DM disease process; ?Reviewed medications with patient and discussed importance of medication adherence. 01-28-2022: The patient does not take any medications for DM. Is mindful of dietary restrictions. Will continue to monitor  for changes. ;        ?Reviewed prescribed diet with patient heart healthy/ADA. 01-28-2022: Will send healthy eating information by the EMMI system, my chart, EMAIL, and  mail to help with effective management of pre-DM; ?Counseled on importance of regular laboratory monitoring as prescribed. 01-28-2022: Reviewed the importance of regular lab work to monitor for changes in DM health and wellness;        ?Discussed plans with patient for ongoing care management follow up and provided patient with direct contact information for care management team;      ?Provided patient with written educational materials related to hypo and hyperglycemia and importance of correct treatment;       ?Reviewed scheduled/upcoming provider appointments including: September 2023 with the pcp;         ?call provider for findings outside established parameters;       ?Review of patient status, including review of consultants reports, relevant laboratory and other test results, and medications completed;       ?Advised patient to discuss changes in health related to DM  with provider;      ?Screening for signs and symptoms of depression related to chronic disease state;        ?Assessed social determinant of health barriers;        ? ?hypothyroidism  (Status: New goal. Goal on Track (progressing): YES.) Long Term Goal  ?Evaluation of current treatment plan related to Hypothyroidism,  self-management and patient's adherence to plan as established by provider. ?Discussed plans with patient for ongoing care management follow up and provided patient with direct contact information for care management team ?Advised patient to call the office for changes in her overall health and if she is noticing being more fatigued or tired; ?Provided education to patient re: hypothyroidism sent in the my Chart system and Emmi platform; ?Reviewed medications with patient and discussed compliance; ?Reviewed scheduled/upcoming provider appointments including 06-2022 with pcp; ?Discussed plans with patient for ongoing care management follow up and provided patient with direct contact information for care management team; ?Advised  patient to discuss questions or concerns about thyroid health and wellness with provider; ? ?Hyperlipidemia:  (Status: New goal. Goal on Track (progressing): YES.) Long Term Goal  ?Lab Results  ?Component Value Date  ? CHOL 229 (H) 12/23/2021  ? HDL 54 12/23/2021  ? LDLCALC 148 (H) 12/23/2021  ? TRIG 147 12/23/2021  ? CHOLHDL 4.2 12/23/2021  ?  ? ?Medication review performed; medication list updated in electronic medical record.  ?Provider established cholesterol goals reviewed; ?Counseled on importance of regular laboratory monitoring as prescribed; ?Provided HLD educational materials; ?Reviewed role and benefits of statin for ASCVD risk reduction; ?Discussed strategies to manage statin-induced myalgias; ?Reviewed importance of limiting foods high in cholesterol; ?Reviewed exercise goals and target of 150 minutes per week; ? ?Pain:  (Status: New goal. Goal on Track (progressing): YES.) Long Term Goal  ?Pain assessment performed. 01-28-2022: The patient has chronic back pain and knee pain. Denies any pain today. States she worked in the yard yesterday and has her back brace on today. Denies any acute findings today. ?Medications reviewed. 01-28-2022: States that she takes Tylenol when needed for pain management and has used Voltaren gel OTC as recommended by the pcp that has been helpful with management of her knee pain ?Reviewed provider established plan for pain management; ?Discussed importance of adherence to all scheduled medical appointments. 01-28-2022: Next appointment with pcp is 06-2022; ?Counseled on the importance of reporting any/all new  or changed pain symptoms or management strategies to pain management provider; ?Advised patient to report to care team affect of pain on daily activities; ?Discussed use of relaxation techniques and/or diversional activities to assist with pain reduction (distraction, imagery, relaxation, massage, acupressure, TENS, heat, and cold application; ?Reviewed with patient  prescribed pharmacological and nonpharmacological pain relief strategies; ?Advised patient to discuss unresolved pain, changes in level or intensity of pain  with provider; ? ?Patient Goals/Self-Care Activities: ?Take med

## 2022-02-19 DIAGNOSIS — H2513 Age-related nuclear cataract, bilateral: Secondary | ICD-10-CM | POA: Diagnosis not present

## 2022-03-25 ENCOUNTER — Telehealth: Payer: Medicare Other

## 2022-03-25 ENCOUNTER — Ambulatory Visit (INDEPENDENT_AMBULATORY_CARE_PROVIDER_SITE_OTHER): Payer: Medicare Other

## 2022-03-25 DIAGNOSIS — M170 Bilateral primary osteoarthritis of knee: Secondary | ICD-10-CM

## 2022-03-25 DIAGNOSIS — R42 Dizziness and giddiness: Secondary | ICD-10-CM

## 2022-03-25 DIAGNOSIS — E039 Hypothyroidism, unspecified: Secondary | ICD-10-CM

## 2022-03-25 DIAGNOSIS — R7303 Prediabetes: Secondary | ICD-10-CM

## 2022-03-25 DIAGNOSIS — E7800 Pure hypercholesterolemia, unspecified: Secondary | ICD-10-CM

## 2022-03-25 DIAGNOSIS — I999 Unspecified disorder of circulatory system: Secondary | ICD-10-CM

## 2022-03-25 NOTE — Chronic Care Management (AMB) (Signed)
Chronic Care Management   CCM RN Visit Note  03/25/2022 Name: Stacy Schmidt MRN: [id number] DOB: 11-24-1950  Subjective: Stacy Schmidt is a 71 y.o. year old female who is a primary care patient of Olin Hauser, DO. The care management team was consulted for assistance with disease management and care coordination needs.    Engaged with patient by telephone for follow up visit in response to provider referral for case management and/or care coordination services.   Consent to Services:  The patient was given information about Chronic Care Management services, agreed to services, and gave verbal consent prior to initiation of services.  Please see initial visit note for detailed documentation.   Patient agreed to services and verbal consent obtained.   Assessment: Review of patient past medical history, allergies, medications, health status, including review of consultants reports, laboratory and other test data, was performed as part of comprehensive evaluation and provision of chronic care management services.   SDOH (Social Determinants of Health) assessments and interventions performed:    CCM Care Plan  No Known Allergies  Outpatient Encounter Medications as of 03/25/2022  Medication Sig   Cholecalciferol (VITAMIN D-3) 125 MCG (5000 UT) TABS Take by mouth.   Diclofenac Sodium (VOLTAREN EX) Apply topically.   fish oil-omega-3 fatty acids 1000 MG capsule Take 2 g by mouth daily.   levothyroxine (SYNTHROID) 100 MCG tablet Take 1 tablet (100 mcg total) by mouth daily before breakfast.   omeprazole (PRILOSEC) 40 MG capsule Take 1 capsule (40 mg total) by mouth daily before breakfast.   No facility-administered encounter medications on file as of 03/25/2022.    Patient Active Problem List   Diagnosis Date Noted   Pure hypercholesterolemia 12/30/2021   Acquired hypothyroidism 11/18/2021   Pre-diabetes 11/18/2021   Gastroesophageal reflux disease without  esophagitis 11/18/2021   Primary osteoarthritis of both knees 11/18/2021   Actinic keratoses 11/18/2021   Syncope and collapse 06/05/2020   Nicotine dependence 06/05/2020    Conditions to be addressed/monitored:HLD, pre-DMII, Hypothyroidism, and Chronic pain, dizziness, possible decreased circulation   Care Plan : RNCM: General Plan of Care (Adult) for Chronic Disease Management and Care Coordination Needs  Updates made by Vanita Ingles, RN since 03/25/2022 12:00 AM     Problem: RNCM: Development of Plan of Care for Chronic Disease Management (Pre- DM, HLD, hypothyroidism, Pain)   Priority: High     Long-Range Goal: RNCM: Effective Management  of Plan of Care for Chronic Disease Management (Pre- DM, HLD, hypothyroidism, Pain)   Start Date: 01/28/2022  Expected End Date: 01/29/2023  Priority: High  Note:   Current Barriers:  Knowledge Deficits related to plan of care for management of HLD, DMII, Chronic Pain, and Hypothyroidism  Chronic Disease Management support and education needs related to HLD, DMII, Chronic Pain, and hyprothyroidism  RNCM Clinical Goal(s):  Patient will verbalize basic understanding of HLD, DMII, Hypothyroidism, and Chronic pain  disease process and self health management plan as evidenced by keeping appointments, following the plan of care, taking medications as directed, and working with the CCM team to effectively manage chronic conditions. take all medications exactly as prescribed and will call provider for medication related questions as evidenced by compliance with medications and calling for refills before running out of medications    attend all scheduled medical appointments: pcp and specialist on a routine basis as evidenced by compliance with appointments and calling for schedule change needs.        demonstrate improved and  ongoing adherence to prescribed treatment plan for HLD, DMII, Hypothyroidism, and Chronic pain  as evidenced by VS stable, routine lab  work, calling the office for changes or questions in chronic conditions and working with the CCM team and providers to optimize health and well being demonstrate ongoing self health care management ability for effective management of chronic conditions as evidenced by working with the CCM team through collaboration with Consulting civil engineer, provider, and care team.   Interventions: 1:1 collaboration with primary care provider regarding development and update of comprehensive plan of care as evidenced by provider attestation and co-signature Inter-disciplinary care team collaboration (see longitudinal plan of care) Evaluation of current treatment plan related to  self management and patient's adherence to plan as established by provider   Diabetes:  (Status: New goal. Goal on Track (progressing): YES.) Long Term Goal   Lab Results  Component Value Date   HGBA1C 6.1 (H) 12/23/2021  Assessed patient's understanding of A1c goal: <7%. 03-25-2022: The patient knows goal of A1C is less than 7.0. Is at goal. Provided education to patient about basic DM disease process. 03-25-2022: The patient is knowledgeable about her DM an effectively manages at this time.; Reviewed medications with patient and discussed importance of medication adherence. 03-25-2022: The patient does not take any medications for DM. Is mindful of dietary restrictions. Will continue to monitor for changes. ;        Reviewed prescribed diet with patient heart healthy/ADA. 01-28-2022: Will send healthy eating information by the EMMI system, my chart, EMAIL, and mail to help with effective management of pre-DM. 03-25-2022: The patient is compliant with heart healthy/ADA diet. The patient has good eating habits. The patient has gotten a scale and is weighing herself daily. Her weight is around 162 to 164. Was 162.2 today.  Counseled on importance of regular laboratory monitoring as prescribed. 03-25-2022: Reviewed the importance of regular lab work to  monitor for changes in DM health and wellness;        Discussed plans with patient for ongoing care management follow up and provided patient with direct contact information for care management team;      Provided patient with written educational materials related to hypo and hyperglycemia and importance of correct treatment;       Reviewed scheduled/upcoming provider appointments including: July 05, 2022 with the pcp;         call provider for findings outside established parameters;       Review of patient status, including review of consultants reports, relevant laboratory and other test results, and medications completed;       Advised patient to discuss changes in health related to DM  with provider;      Screening for signs and symptoms of depression related to chronic disease state;        Assessed social determinant of health barriers;         hypothyroidism  (Status: New goal. Goal on Track (progressing): YES.) Long Term Goal  Evaluation of current treatment plan related to Hypothyroidism,  self-management and patient's adherence to plan as established by provider. 03-25-2022: The patient is compliant with the plan of care for her hypothyroidism. She denies any new concerns with her hypothyroidism. Discussed plans with patient for ongoing care management follow up and provided patient with direct contact information for care management team Advised patient to call the office for changes in her overall health and if she is noticing being more fatigued or tired; Provided education to patient  re: hypothyroidism sent in the my Chart system and Emmi platform; Reviewed medications with patient and discussed compliance. 03-25-2022: The patient is compliant with her medications. ; Reviewed scheduled/upcoming provider appointments including 07-05-2022 with pcp; Discussed plans with patient for ongoing care management follow up and provided patient with direct contact information for care management  team; Advised patient to discuss questions or concerns about thyroid health and wellness with provider;  Hyperlipidemia:  (Status: New goal. Goal on Track (progressing): YES.) Long Term Goal  Lab Results  Component Value Date   CHOL 229 (H) 12/23/2021   HDL 54 12/23/2021   LDLCALC 148 (H) 12/23/2021   TRIG 147 12/23/2021   CHOLHDL 4.2 12/23/2021     Medication review performed; medication list updated in electronic medical record. 03-25-2022: The patient takes fish oil for her cholesterol management Provider established cholesterol goals reviewed. 03-25-2022: Review of goals  Counseled on importance of regular laboratory monitoring as prescribed. 03-25-2022: The patient has labs on a regular basis; Provided HLD educational materials; Reviewed role and benefits of statin for ASCVD risk reduction; Discussed strategies to manage statin-induced myalgias; Reviewed importance of limiting foods high in cholesterol; Reviewed exercise goals and target of 150 minutes per week;  Pain:  (Status: New goal. Goal on Track (progressing): YES.) Long Term Goal  Pain assessment performed. 01-28-2022: The patient has chronic back pain and knee pain. Denies any pain today. States she worked in the yard yesterday and has her back brace on today. Denies any acute findings today. 03-25-2022: The patient denies any pain today. She is pacing her activity and also using the Voltaren gel as needed for pain relief and discomfort.  Medications reviewed. 03-25-2022: States that she takes Tylenol when needed for pain management and has used Voltaren gel OTC as recommended by the pcp that has been helpful with management of her knee pain. The patient states that the Voltaren gel has been very helpful in the effective management of her pain and discomfort.  Reviewed provider established plan for pain management;03-25-2022: Is compliant with the plan of care Discussed importance of adherence to all scheduled medical appointments. Next  appointment with pcp is 07-05-2022; Counseled on the importance of reporting any/all new or changed pain symptoms or management strategies to pain management provider; Advised patient to report to care team affect of pain on daily activities; Discussed use of relaxation techniques and/or diversional activities to assist with pain reduction (distraction, imagery, relaxation, massage, acupressure, TENS, heat, and cold application; Reviewed with patient prescribed pharmacological and nonpharmacological pain relief strategies; Advised patient to discuss unresolved pain, changes in level or intensity of pain  with provider;   Dizziness and possible venous insufficiency   (Status: New goal.) Long Term Goal  Evaluation of current treatment plan related to Vertigo and possible venous insufficiency,  self-management and patient's adherence to plan as established by provider. Discussed plans with patient for ongoing care management follow up and provided patient with direct contact information for care management team Advised patient to call the provider for worsening sx and sx of vertigo or other concerns related to circulation in her left leg ; Provided education to patient re: changing position slowly, monitoring for fall and safety concerns, watching for blood pressure changes, and monitor for coolness to left leg, use of compression hose, trying Epley's maneuver or acute onset of pain and not to rub the leg if having acute onset of pain and to call the provider for changes on concerns; Reviewed medications with patient and  discussed the use of meclizine for dizziness to se if that will help when she is experiencing dizziness; Collaborated with pcp concerning dizziness and changes in coloration to left leg and the patient using compression hose with help for circulation. Also advised the patient to call the provider for an earlier appointment if sx and sx are worsening. ; Reviewed scheduled/upcoming provider  appointments including 07-05-2022  Discussed plans with patient for ongoing care management follow up and provided patient with direct contact information for care management team; Advised patient to discuss new concerns about circulation and dizziness  with provider; Screening for signs and symptoms of depression related to chronic disease state;  Assessed social determinant of health barriers;    Patient Goals/Self-Care Activities: Take medications as prescribed   Attend all scheduled provider appointments Call pharmacy for medication refills 3-7 days in advance of running out of medications Attend church or other social activities Perform all self care activities independently  Perform IADL's (shopping, preparing meals, housekeeping, managing finances) independently Call provider office for new concerns or questions  Work with the social worker to address care coordination needs and will continue to work with the clinical team to address health care and disease management related needs call the Suicide and Crisis Lifeline: 988 call the [place] National Suicide Prevention Lifeline: 4434767388 or TTY: (939)433-4025 TTY (989) 326-0738) to talk to a trained counselor call 1-800-273-TALK (toll free, 24 hour hotline) if experiencing a Mental Health or [place]  schedule appointment with eye doctor check feet daily for cuts, sores or redness set goal weight trim toenails straight across drink 6 to 8 glasses of water each day eat fish at least once per week fill half of plate with vegetables limit fast food meals to no more than 1 per week manage portion size prepare main meal at home 3 to 5 days each week read food labels for fat, fiber, carbohydrates and portion size reduce red meat to 2 to 3 times a week set a realistic goal keep feet up while sitting wash and dry feet carefully every day wear comfortable, cotton socks wear comfortable, well-fitting shoes - call doctor  with any symptoms you believe are related to your medicine - call doctor when you experience any new symptoms - go to all doctor appointments as scheduled - adhere to prescribed diet: heart healthy/ADA diet       Plan:Telephone follow up appointment with care management team member scheduled for:  06-03-2022 at 1 pm  [place], MSN, [place] Mobile: [phone]

## 2022-03-25 NOTE — Patient Instructions (Signed)
Visit Information  Thank you for taking time to visit with me today. Please don't hesitate to contact me if I can be of assistance to you before our next scheduled telephone appointment.  Following are the goals we discussed today:  Diabetes:  (Status: New goal. Goal on Track (progressing): YES.) Long Term Goal         Lab Results  Component Value Date    HGBA1C 6.1 (H) 12/23/2021  Assessed patient's understanding of A1c goal: <7%. 03-25-2022: The patient knows goal of A1C is less than 7.0. Is at goal. Provided education to patient about basic DM disease process. 03-25-2022: The patient is knowledgeable about her DM an effectively manages at this time.; Reviewed medications with patient and discussed importance of medication adherence. 03-25-2022: The patient does not take any medications for DM. Is mindful of dietary restrictions. Will continue to monitor for changes. ;        Reviewed prescribed diet with patient heart healthy/ADA. 01-28-2022: Will send healthy eating information by the EMMI system, my chart, EMAIL, and mail to help with effective management of pre-DM. 03-25-2022: The patient is compliant with heart healthy/ADA diet. The patient has good eating habits. The patient has gotten a scale and is weighing herself daily. Her weight is around 162 to 164. Was 162.2 today.  Counseled on importance of regular laboratory monitoring as prescribed. 03-25-2022: Reviewed the importance of regular lab work to monitor for changes in DM health and wellness;        Discussed plans with patient for ongoing care management follow up and provided patient with direct contact information for care management team;      Provided patient with written educational materials related to hypo and hyperglycemia and importance of correct treatment;       Reviewed scheduled/upcoming provider appointments including: July 05, 2022 with the pcp;         call provider for findings outside established parameters;        Review of patient status, including review of consultants reports, relevant laboratory and other test results, and medications completed;       Advised patient to discuss changes in health related to DM  with provider;      Screening for signs and symptoms of depression related to chronic disease state;        Assessed social determinant of health barriers;          hypothyroidism  (Status: New goal. Goal on Track (progressing): YES.) Long Term Goal  Evaluation of current treatment plan related to Hypothyroidism,  self-management and patient's adherence to plan as established by provider. 03-25-2022: The patient is compliant with the plan of care for her hypothyroidism. She denies any new concerns with her hypothyroidism. Discussed plans with patient for ongoing care management follow up and provided patient with direct contact information for care management team Advised patient to call the office for changes in her overall health and if she is noticing being more fatigued or tired; Provided education to patient re: hypothyroidism sent in the my Chart system and [employer] platform; Reviewed medications with patient and discussed compliance. 03-25-2022: The patient is compliant with her medications. ; Reviewed scheduled/upcoming provider appointments including 07-05-2022 with pcp; Discussed plans with patient for ongoing care management follow up and provided patient with direct contact information for care management team; Advised patient to discuss questions or concerns about thyroid health and wellness with provider;   Hyperlipidemia:  (Status: New goal. Goal on Track (progressing): YES.) Long  Term Goal       Lab Results  Component Value Date    CHOL 229 (H) 12/23/2021    HDL 54 12/23/2021    LDLCALC 148 (H) 12/23/2021    TRIG 147 12/23/2021    CHOLHDL 4.2 12/23/2021      Medication review performed; medication list updated in electronic medical record. 03-25-2022: The patient takes fish oil for  her cholesterol management Provider established cholesterol goals reviewed. 03-25-2022: Review of goals  Counseled on importance of regular laboratory monitoring as prescribed. 03-25-2022: The patient has labs on a regular basis; Provided HLD educational materials; Reviewed role and benefits of statin for ASCVD risk reduction; Discussed strategies to manage statin-induced myalgias; Reviewed importance of limiting foods high in cholesterol; Reviewed exercise goals and target of 150 minutes per week;   Pain:  (Status: New goal. Goal on Track (progressing): YES.) Long Term Goal  Pain assessment performed. 01-28-2022: The patient has chronic back pain and knee pain. Denies any pain today. States she worked in the yard yesterday and has her back brace on today. Denies any acute findings today. 03-25-2022: The patient denies any pain today. She is pacing her activity and also using the Voltaren gel as needed for pain relief and discomfort.  Medications reviewed. 03-25-2022: States that she takes Tylenol when needed for pain management and has used Voltaren gel OTC as recommended by the pcp that has been helpful with management of her knee pain. The patient states that the Voltaren gel has been very helpful in the effective management of her pain and discomfort.  Reviewed provider established plan for pain management;03-25-2022: Is compliant with the plan of care Discussed importance of adherence to all scheduled medical appointments. Next appointment with pcp is 07-05-2022; Counseled on the importance of reporting any/all new or changed pain symptoms or management strategies to pain management provider; Advised patient to report to care team affect of pain on daily activities; Discussed use of relaxation techniques and/or diversional activities to assist with pain reduction (distraction, imagery, relaxation, massage, acupressure, TENS, heat, and cold application; Reviewed with patient prescribed pharmacological and  nonpharmacological pain relief strategies; Advised patient to discuss unresolved pain, changes in level or intensity of pain  with provider;     Dizziness and possible venous insufficiency   (Status: New goal.) Long Term Goal  Evaluation of current treatment plan related to Vertigo and possible venous insufficiency,  self-management and patient's adherence to plan as established by provider. Discussed plans with patient for ongoing care management follow up and provided patient with direct contact information for care management team Advised patient to call the provider for worsening sx and sx of vertigo or other concerns related to circulation in her left leg ; Provided education to patient re: changing position slowly, monitoring for fall and safety concerns, watching for blood pressure changes, and monitor for coolness to left leg, use of compression hose, trying Epley's maneuver or acute onset of pain and not to rub the leg if having acute onset of pain and to call the provider for changes on concerns; Reviewed medications with patient and discussed the use of meclizine for dizziness to se if that will help when she is experiencing dizziness; Collaborated with pcp concerning dizziness and changes in coloration to left leg and the patient using compression hose with help for circulation. Also advised the patient to call the provider for an earlier appointment if sx and sx are worsening. ; Reviewed scheduled/upcoming provider appointments including 07-05-2022  Discussed plans  with patient for ongoing care management follow up and provided patient with direct contact information for care management team; Advised patient to discuss new concerns about circulation and dizziness  with provider; Screening for signs and symptoms of depression related to chronic disease state;  Assessed social determinant of health barriers;      Our next appointment is by telephone on 06-03-2022 at 1 pm  Please call the  care guide team at [phone] if you need to cancel or reschedule your appointment.   If you are experiencing a Mental Health or [place] or need someone to talk to, please call the Suicide and Crisis Lifeline: 988 call the [place] National Suicide Prevention Lifeline: (534) 436-8648 or TTY: 701 332 3856 TTY 610 790 0888) to talk to a trained counselor call 1-800-273-TALK (toll free, 24 hour hotline)   Patient verbalizes understanding of instructions and care plan provided today and agrees to view in [place]. Active MyChart status and patient understanding of how to access instructions and care plan via MyChart confirmed with patient.     Noreene Larsson RN, MSN, [facility] Mobile: [phone]   How to Perform the Epley Maneuver The Epley maneuver is an exercise that relieves symptoms of vertigo. Vertigo is the feeling that you or your surroundings are moving when they are not. When you feel vertigo, you may feel like the room is spinning and may have trouble walking. The Epley maneuver is used for a type of vertigo caused by a calcium deposit in a part of the inner ear. The maneuver involves changing head positions to help the deposit move out of the area. You can do this maneuver at home whenever you have symptoms of vertigo. You can repeat it in 24 hours if your vertigo has not gone away. Even though the Epley maneuver may relieve your vertigo for a few weeks, it is possible that your symptoms will return. This maneuver relieves vertigo, but it does not relieve dizziness. What are the risks? If it is done correctly, the Epley maneuver is considered safe. Sometimes it can lead to dizziness or nausea that goes away after a short time. If you develop other symptoms--such as changes in vision, weakness, or numbness--stop doing the maneuver and call your health care provider. Supplies needed: A bed or  table. A pillow. How to do the Epley maneuver     Sit on the edge of a bed or table with your back straight and your legs extended or hanging over the edge of the bed or table. Turn your head halfway toward the affected ear or side as told by your health care provider. Lie backward quickly with your head turned until you are lying flat on your back. Your head should dangle (head-hanging position). You may want to position a pillow under your shoulders. Hold this position for at least 30 seconds. If you feel dizzy or have symptoms of vertigo, continue to hold the position until the symptoms stop. Turn your head to the opposite direction until your unaffected ear is facing down. Your head should continue to dangle. Hold this position for at least 30 seconds. If you feel dizzy or have symptoms of vertigo, continue to hold the position until the symptoms stop. Turn your whole body to the same side as your head so that you are positioned on your side. Your head will now be nearly facedown and no longer needs to dangle. Hold for at least 30  seconds. If you feel dizzy or have symptoms of vertigo, continue to hold the position until the symptoms stop. Sit back up. You can repeat the maneuver in 24 hours if your vertigo does not go away. Follow these instructions at home: For 24 hours after doing the Epley maneuver: Keep your head in an upright position. When lying down to sleep or rest, keep your head raised (elevated) with two or more pillows. Avoid excessive neck movements. Activity Do not drive or use machinery if you feel dizzy. After doing the Epley maneuver, return to your normal activities as told by your health care provider. Ask your health care provider what activities are safe for you. General instructions Drink enough fluid to keep your urine pale yellow. Do not drink alcohol. Take over-the-counter and prescription medicines only as told by your health care provider. Keep all follow-up  visits. This is important. Preventing vertigo symptoms Ask your health care provider if there is anything you should do at home to prevent vertigo. He or she may recommend that you: Keep your head elevated with two or more pillows while you sleep. Do not sleep on the side of your affected ear. Get up slowly from bed. Avoid sudden movements during the day. Avoid extreme head positions or movement, such as looking up or bending over. Contact a health care provider if: Your vertigo gets worse. You have other symptoms, including: Nausea. Vomiting. Headache. Get help right away if you: Have vision changes. Have a headache or neck pain that is severe or getting worse. Cannot stop vomiting. Have new numbness or weakness in any part of your body. These symptoms may represent a serious problem that is an emergency. Do not wait to see if the symptoms will go away. Get medical help right away. Call your local emergency services (911 in the [place] Do not drive yourself to the hospital. Summary Vertigo is the feeling that you or your surroundings are moving when they are not. The Epley maneuver is an exercise that relieves symptoms of vertigo. If the Epley maneuver is done correctly, it is considered safe. This information is not intended to replace advice given to you by your health care provider. Make sure you discuss any questions you have with your health care provider. Document Revised: 09/03/2020 Document Reviewed: 09/03/2020 Elsevier Patient Education  [place].

## 2022-04-16 DIAGNOSIS — E039 Hypothyroidism, unspecified: Secondary | ICD-10-CM | POA: Diagnosis not present

## 2022-04-16 DIAGNOSIS — Z794 Long term (current) use of insulin: Secondary | ICD-10-CM

## 2022-04-16 DIAGNOSIS — E785 Hyperlipidemia, unspecified: Secondary | ICD-10-CM

## 2022-04-16 DIAGNOSIS — E1169 Type 2 diabetes mellitus with other specified complication: Secondary | ICD-10-CM | POA: Diagnosis not present

## 2022-06-03 ENCOUNTER — Ambulatory Visit: Payer: Self-pay

## 2022-06-03 ENCOUNTER — Telehealth: Payer: Medicare Other

## 2022-06-03 NOTE — Patient Instructions (Signed)
Visit Information  Thank you for taking time to visit with me today. Please don't hesitate to contact me if I can be of assistance to you.   Following are the goals we discussed today:   Goals Addressed             This Visit's Progress    RNCM: Effective Management of Chronic conditions       Care Coordination Interventions: Evaluation of current treatment plan related to pre-DM and her leg turning dark at the ankle and patient's adherence to plan as established by provider Advised patient to provide appropriate vaccination information to provider or CM team member at next visit Advised patient to Talk to the pcp about possible circulation concerns in bilateral legs Provided education to patient re: healthy eating, changes in diet, supplemental product she has started taking called Vinia. Advised the patient to take a bottle of this with her to her appointment with pcp on 07-05-2022 Reviewed medications with patient and discussed compliance with medications  Provided patient with weight loss concerns, circulation concerns,  educational materials related to weight loss and circulation concerns Reviewed scheduled/upcoming provider appointments including 07-05-2022 with pcp Advised patient, providing education and rationale, to check cbg as directed or feeling different and record, calling pcp for findings outside established parameters  Discussed plans with patient for ongoing care management follow up and provided patient with direct contact information for care management team Advised patient to discuss questions and concerns about chronic conditions  with provider Screening for signs and symptoms of depression related to chronic disease state  Assessed social determinant of health barriers AWV  has been completed.            Our next appointment is by telephone on 09-16-2022 at 1 pm  Please call the care guide team at [phone] if you need to cancel or reschedule your  appointment.   If you are experiencing a Mental Health or [place] or need someone to talk to, please call the Suicide and Crisis Lifeline: 988 call the [place] National Suicide Prevention Lifeline: (774)688-2026 or TTY: 615-627-4429 TTY 951-665-6501) to talk to a trained counselor call 1-800-273-TALK (toll free, 24 hour hotline)  Patient verbalizes understanding of instructions and care plan provided today and agrees to view in [place]. Active MyChart status and patient understanding of how to access instructions and care plan via MyChart confirmed with patient.     Telephone follow up appointment with care management team member scheduled for: 09-16-2022 at 1 pm  [place], MSN, [facility] Mobile: [phone]    Circulation Tips  DO: - change positions frequently - take showers and baths to enhance circulation - massage extremities with lotion after bathing - eat well and regularly - walk for at least __20__ minutes each day DON'T: - stand, sit or lie for prolonged periods of time - wear tight clothing on legs, hands and feet - drink alcohol to stimulate circulation   Copyright  VHI. All rights reserved.    Exercise Information for Aging Adults Staying physically active is important as you age. Physical activity and exercise can help in maintaining quality of life, health, physical function, and reducing falls. The four types of exercises that are best for older adults are endurance, strength, balance, and flexibility. Contact your health care provider before you start any exercise routine. Ask your health care provider what activities are safe for you. What are the risks? Risks associated  with exercising include: Overdoing it. This may lead to sore muscles or fatigue. Falls. Injuries. Dehydration. How to do these exercises Endurance exercises Endurance (aerobic) exercises raise your breathing rate and  heart rate. Increasing your endurance helps you do everyday tasks and stay healthy. By improving the health of your body system that includes your heart, lungs, and blood vessels (circulatory system), you may also delay or prevent diseases such as heart disease, diabetes, and weak bones (osteoporosis). Types of endurance exercises include: Sports. Indoor activities, such as using gym equipment, doing water aerobics, or dancing. Outdoor activities, such as biking or jogging. Tasks around the house, such as gardening, yard work, and heavy household chores like cleaning. Walking, such as hiking or walking around your neighborhood. When doing endurance exercises, make sure you: Are aware of your surroundings. Use safety equipment as directed. Dress in layers when exercising outdoors. Drink plenty of water to stay well hydrated. Build up endurance slowly. Start with 10 minutes at a time, and gradually build up to doing 30 minutes at a time. Unless your health care provider gave you different instructions, aim to exercise for a total of 150 minutes a week. Spread out that time so you are working on endurance 3 or more days a week. Strength exercises Lifting, pulling, or pushing weights helps to strengthen muscles. Having stronger muscles makes it easier to do everyday activities, such as getting up from a chair, climbing stairs, carrying groceries, and playing with grandchildren. Strength exercises include arm and leg exercises that may be done: With weights. Without weights (using your own body weight). With a resistance band. When doing strength exercises: Move smoothly and steadily. Do not suddenly thrust or jerk the weights, the resistance band, or your body. Start with no weights or with light weights, and gradually add more weight over time. Eventually, aim to use weights that are hard or very hard for you to lift. This means that you are able to do 8 repetitions with the weight, and the last few  repetitions are very challenging. Lift or push weights into position for 3 seconds, hold the position for 1 second, and then take 3 seconds to return to your starting position. Breathe out (exhale) during difficult movements, like lifting or pushing weights. Breathe in (inhale) to relax your muscles before the next repetition. Consider alternating arms or legs, especially when you first start strength exercises. Expect some slight muscle soreness after each session. Do strength exercises on 2 or more days a week, for 30 minutes at a time. Avoid exercising the same muscle groups two days in a row. For example, if you work on your leg muscles one day, work on your arm muscles the next day. When you can do two sets of 10-15 repetitions with a certain weight, increase the amount of weight. Balance exercises Balance exercises can help to prevent falls. Balance exercises include: Standing on one foot. Heel-to-toe walk. Balance walk. Tai chi. Make sure you have something sturdy to hold onto while doing balance exercises, such as a sturdy chair. As your balance improves, challenge yourself by holding on to the chair with one hand instead of two, and then with no hands. Trying exercises with your eyes closed also challenges your balance, but be sure to have a sturdy surface (like a countertop) close by in case you need it. Do balance exercises as often as you want, or as often as directed by your health care provider. Flexibility exercises  Flexibility exercises improve how far  you can bend, straighten, move, or rotate parts of your body (range of motion). These exercises also help you do everyday activities such as getting dressed or reaching for objects. Flexibility exercises include stretching different parts of the body, and they may be done in a standing or seated position or on the floor. When stretching, make sure you: Keep a slight bend in your arms and legs. Avoid completely straightening  ("locking") your joints. Do not stretch so far that you feel pain. You should feel a mild stretching feeling. You may try stretching farther as you become more flexible over time. Relax and breathe between stretches. Hold on to something sturdy for balance as needed. Hold each stretch for 10-30 seconds. Repeat each stretch 3-5 times. General safety tips Exercise in well-lit areas. Do not hold your breath during exercises or stretches. Warm up before exercising, and cool down after exercising. This can help prevent injury. Drink plenty of water during exercise or any activity that makes you sweat. If you are not sure if an exercise is safe for you, or you are not sure how to do an exercise, talk with your health care provider. This is especially important if you have had surgery on muscles, bones, or joints (orthopedic surgery). Where to find more information You can find more information about exercise for older adults from: Your local health department, fitness center, or community center. These facilities may have programs for aging adults. [employer] on Aging: http://kim-miller.com/ National Council on Aging: www.ncoa.org Summary Staying physically active is important as you age. Doing endurance, strength, balance, and flexibility exercises can help in maintaining quality of life, health, physical function, and reducing falls. Make sure to contact your health care provider before you start any exercise routine. Ask your health care provider what activities are safe for you. This information is not intended to replace advice given to you by your health care provider. Make sure you discuss any questions you have with your health care provider. Document Revised: 02/16/2021 Document Reviewed: 02/16/2021 Elsevier Patient Education  [place] [employer] Calories are units of energy. Your body needs a certain number of calories from food to keep going  throughout the day. When you eat or drink more calories than your body needs, your body stores the extra calories mostly as fat. When you eat or drink fewer calories than your body needs, your body burns fat to get the energy it needs. Calorie counting means keeping track of how many calories you eat and drink each day. Calorie counting can be helpful if you need to lose weight. If you eat fewer calories than your body needs, you should lose weight. Ask your health care provider what a healthy weight is for you. For calorie counting to work, you will need to eat the right number of calories each day to lose a healthy amount of weight per week. A dietitian can help you figure out how many calories you need in a day and will suggest ways to reach your calorie goal. A healthy amount of weight to lose each week is usually 1-2 lb (0.5-0.9 kg). This usually means that your daily calorie intake should be reduced by 500-750 calories. Eating 1,200-1,500 calories a day can help most women lose weight. Eating 1,500-1,800 calories a day can help most men lose weight. What do I need to know about calorie counting? Work with your health care provider or dietitian to determine how many calories you should  get each day. To meet your daily calorie goal, you will need to: Find out how many calories are in each food that you would like to eat. Try to do this before you eat. Decide how much of the food you plan to eat. Keep a food log. Do this by writing down what you ate and how many calories it had. To successfully lose weight, it is important to balance calorie counting with a healthy lifestyle that includes regular activity. Where do I find calorie information?  The number of calories in a food can be found on a Nutrition Facts label. If a food does not have a Nutrition Facts label, try to look up the calories online or ask your dietitian for help. Remember that calories are listed per serving. If you choose to have  more than one serving of a food, you will have to multiply the calories per serving by the number of servings you plan to eat. For example, the label on a package of bread might say that a serving size is 1 slice and that there are 90 calories in a serving. If you eat 1 slice, you will have eaten 90 calories. If you eat 2 slices, you will have eaten 180 calories. How do I keep a food log? After each time that you eat, record the following in your food log as soon as possible: What you ate. Be sure to include toppings, sauces, and other extras on the food. How much you ate. This can be measured in cups, ounces, or number of items. How many calories were in each food and drink. The total number of calories in the food you ate. Keep your food log near you, such as in a pocket-sized notebook or on an app or website on your mobile phone. Some programs will calculate calories for you and show you how many calories you have left to meet your daily goal. What are some portion-control tips? Know how many calories are in a serving. This will help you know how many servings you can have of a certain food. Use a measuring cup to measure serving sizes. You could also try weighing out portions on a kitchen scale. With time, you will be able to estimate serving sizes for some foods. Take time to put servings of different foods on your favorite plates or in your favorite bowls and cups so you know what a serving looks like. Try not to eat straight from a food's packaging, such as from a bag or box. Eating straight from the package makes it hard to see how much you are eating and can lead to overeating. Put the amount you would like to eat in a cup or on a plate to make sure you are eating the right portion. Use smaller plates, glasses, and bowls for smaller portions and to prevent overeating. Try not to multitask. For example, avoid watching TV or using your computer while eating. If it is time to eat, sit down at a  table and enjoy your food. This will help you recognize when you are full. It will also help you be more mindful of what and how much you are eating. What are tips for following this plan? Reading food labels Check the calorie count compared with the serving size. The serving size may be smaller than what you are used to eating. Check the source of the calories. Try to choose foods that are high in protein, fiber, and vitamins, and low in saturated  fat, trans fat, and sodium. Shopping Read nutrition labels while you shop. This will help you make healthy decisions about which foods to buy. Pay attention to nutrition labels for low-fat or fat-free foods. These foods sometimes have the same number of calories or more calories than the full-fat versions. They also often have added sugar, starch, or salt to make up for flavor that was removed with the fat. Make a grocery list of lower-calorie foods and stick to it. Cooking Try to cook your favorite foods in a healthier way. For example, try baking instead of frying. Use low-fat dairy products. Meal planning Use more fruits and vegetables. One-half of your plate should be fruits and vegetables. Include lean proteins, such as chicken, [place], and fish. Lifestyle Each week, aim to do one of the following: 150 minutes of moderate exercise, such as walking. 75 minutes of vigorous exercise, such as running. General information Know how many calories are in the foods you eat most often. This will help you calculate calorie counts faster. Find a way of tracking calories that works for you. Get creative. Try different apps or programs if writing down calories does not work for you. What foods should I eat?  Eat nutritious foods. It is better to have a nutritious, high-calorie food, such as an avocado, than a food with few nutrients, such as a bag of potato chips. Use your calories on foods and drinks that will fill you up and will not leave you hungry  soon after eating. Examples of foods that fill you up are nuts and nut butters, vegetables, lean proteins, and high-fiber foods such as whole grains. High-fiber foods are foods with more than 5 g of fiber per serving. Pay attention to calories in drinks. Low-calorie drinks include water and unsweetened drinks. The items listed above may not be a complete list of foods and beverages you can eat. Contact a dietitian for more information. What foods should I limit? Limit foods or drinks that are not good sources of vitamins, minerals, or protein or that are high in unhealthy fats. These include: Candy. Other sweets. Sodas, specialty coffee drinks, alcohol, and juice. The items listed above may not be a complete list of foods and beverages you should avoid. Contact a dietitian for more information. How do I count calories when eating out? Pay attention to portions. Often, portions are much larger when eating out. Try these tips to keep portions smaller: Consider sharing a meal instead of getting your own. If you get your own meal, eat only half of it. Before you start eating, ask for a container and put half of your meal into it. When available, consider ordering smaller portions from the menu instead of full portions. Pay attention to your food and drink choices. Knowing the way food is cooked and what is included with the meal can help you eat fewer calories. If calories are listed on the menu, choose the lower-calorie options. Choose dishes that include vegetables, fruits, whole grains, low-fat dairy products, and lean proteins. Choose items that are boiled, broiled, grilled, or steamed. Avoid items that are buttered, battered, fried, or served with cream sauce. Items labeled as crispy are usually fried, unless stated otherwise. Choose water, low-fat milk, unsweetened iced tea, or other drinks without added sugar. If you want an alcoholic beverage, choose a lower-calorie option, such as a glass of  wine or light beer. Ask for dressings, sauces, and syrups on the side. These are usually high in calories, so you  should limit the amount you eat. If you want a salad, choose a garden salad and ask for grilled meats. Avoid extra toppings such as bacon, cheese, or fried items. Ask for the dressing on the side, or ask for olive oil and vinegar or lemon to use as dressing. Estimate how many servings of a food you are given. Knowing serving sizes will help you be aware of how much food you are eating at restaurants. Where to find more information Centers for Disease Control and Prevention: http://www.wolf.info/ U.S. Department of Agriculture: http://www.wilson-mendoza.org/ Summary Calorie counting means keeping track of how many calories you eat and drink each day. If you eat fewer calories than your body needs, you should lose weight. A healthy amount of weight to lose per week is usually 1-2 lb (0.5-0.9 kg). This usually means reducing your daily calorie intake by 500-750 calories. The number of calories in a food can be found on a Nutrition Facts label. If a food does not have a Nutrition Facts label, try to look up the calories online or ask your dietitian for help. Use smaller plates, glasses, and bowls for smaller portions and to prevent overeating. Use your calories on foods and drinks that will fill you up and not leave you hungry shortly after a meal. This information is not intended to replace advice given to you by your health care provider. Make sure you discuss any questions you have with your health care provider. Document Revised: 11/15/2019 Document Reviewed: 11/15/2019 Elsevier Patient Education  [place].

## 2022-06-03 NOTE — Patient Outreach (Signed)
  Care Coordination   Follow Up Visit Note   06/03/2022 Name: Stacy Schmidt MRN: [id number] DOB: [birth year]  Stacy Schmidt is a 71 y.o. year old female who sees [name], DO for primary care. I spoke with  Stacy Schmidt by phone today  What matters to the patients health and wellness today?  Making sure eating the right things, weight loss, and circulation  concerns with her legs    Goals Addressed             This Visit's Progress    RNCM: Effective Management of Chronic conditions       Care Coordination Interventions: Evaluation of current treatment plan related to pre-DM and her leg turning dark at the ankle and patient's adherence to plan as established by provider Advised patient to provide appropriate vaccination information to provider or CM team member at next visit Advised patient to Talk to the pcp about possible circulation concerns in bilateral legs Provided education to patient re: healthy eating, changes in diet, supplemental product she has started taking called Vinia. Advised the patient to take a bottle of this with her to her appointment with pcp on 07-05-2022 Reviewed medications with patient and discussed compliance with medications  Provided patient with weight loss concerns, circulation concerns,  educational materials related to weight loss and circulation concerns Reviewed scheduled/upcoming provider appointments including 07-05-2022 with pcp Advised patient, providing education and rationale, to check cbg as directed or feeling different and record, calling pcp for findings outside established parameters  Discussed plans with patient for ongoing care management follow up and provided patient with direct contact information for care management team Advised patient to discuss questions and concerns about chronic conditions  with provider Screening for signs and symptoms of depression related to chronic disease state  Assessed social  determinant of health barriers AWV  has been completed.            SDOH assessments and interventions completed:  Yes  SDOH Interventions Today    Flowsheet Row Most Recent Value  SDOH Interventions   Food Insecurity Interventions Intervention Not Indicated  Financial Strain Interventions Intervention Not Indicated  Housing Interventions Intervention Not Indicated  Stress Interventions Intervention Not Indicated  Social Connections Interventions Intervention Not Indicated  Transportation Interventions Intervention Not Indicated        Care Coordination Interventions Activated:  Yes  Care Coordination Interventions:  Yes, provided   Follow up plan: Follow up call scheduled for 09-16-2022 at 1 pm    Encounter Outcome:  Pt. Visit Completed   Alto [place] RN, MSN, CCM Community Care Coordinator [facility]  [facility] Mobile: [phone]

## 2022-06-07 IMAGING — CT CT HEAD W/O CM
3 series · 16 of 45 positions shown, 19 images · non-contrast
Comparison: None.

CLINICAL DATA: Body aches, emesis, syncopal episodes for 3 days

EXAM:
CT HEAD WITHOUT CONTRAST
TECHNIQUE: Contiguous axial images were obtained from the base of the skull
through the vertex without intravenous contrast.

[Series 2: head wo · axial · 0.40mm/px · z∈[-122,-7]mm · 10 of 28 slices shown, 13 images]
[im 3/28  brain]
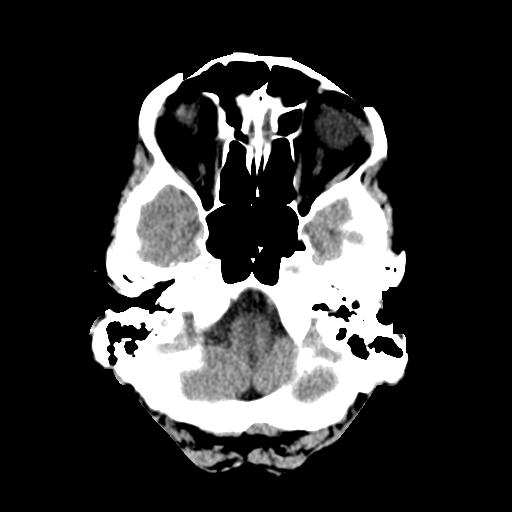
[im 3/28  bone]
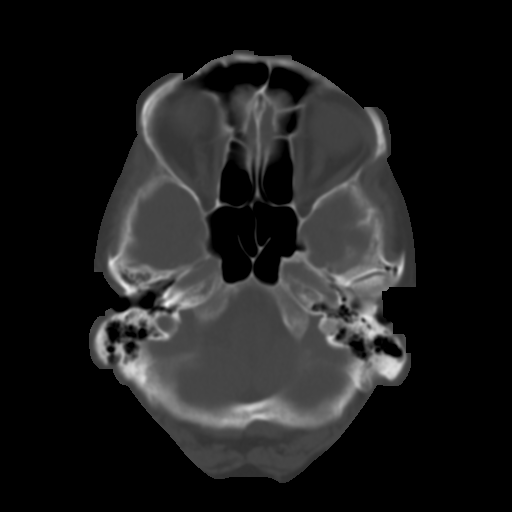
[im 5/28  brain]
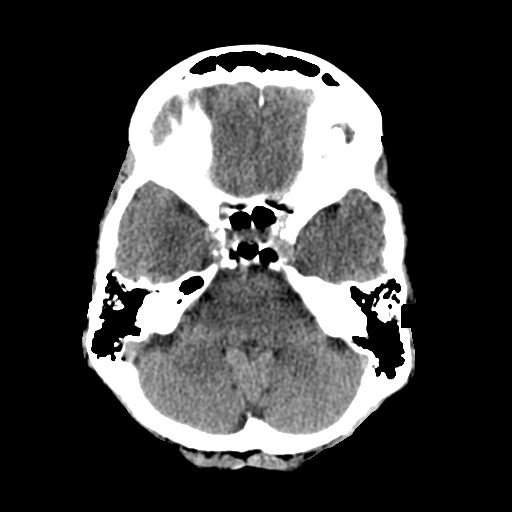
[im 8/28  brain]
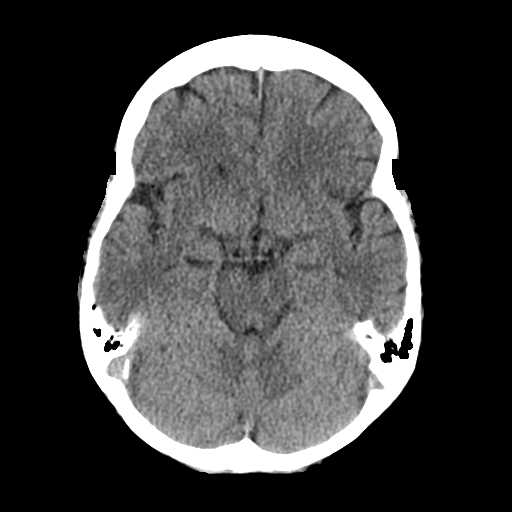
[im 11/28  brain]
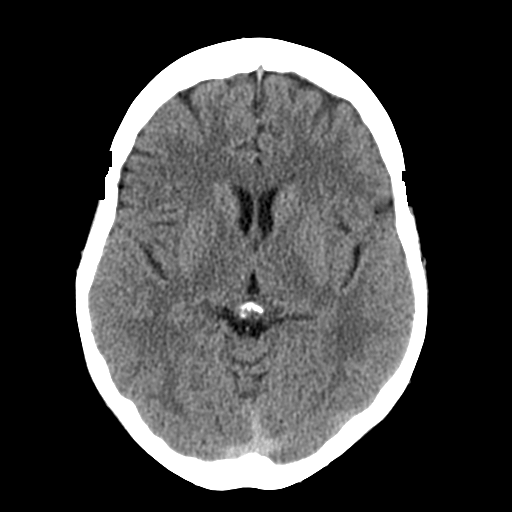
[im 13/28  brain]
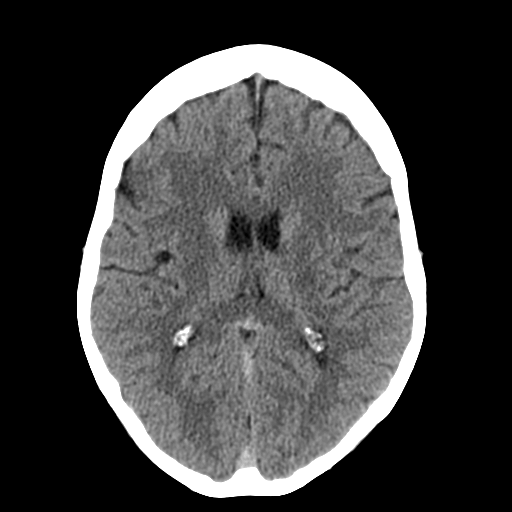
[im 13/28  bone]
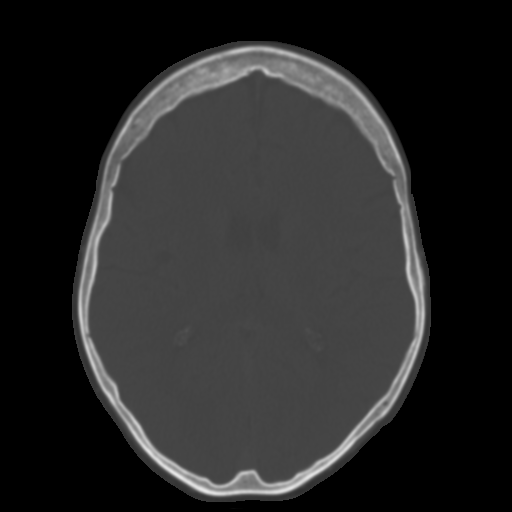
[im 16/28  brain]
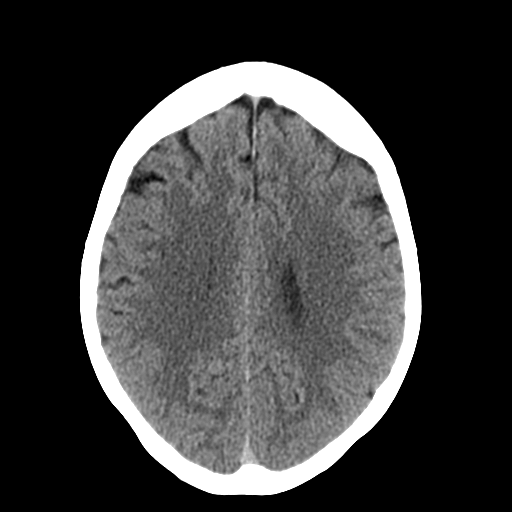
[im 18/28  brain]
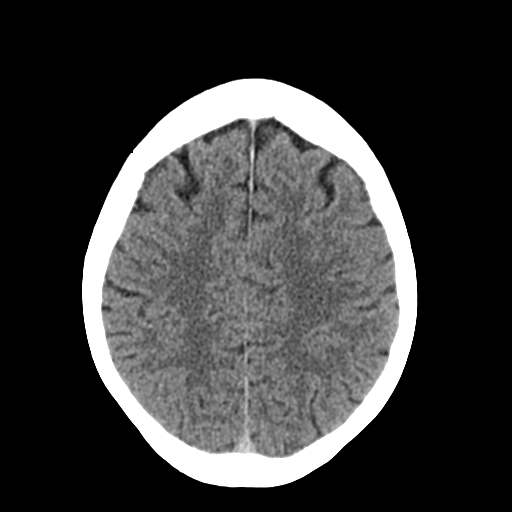
[im 21/28  brain]
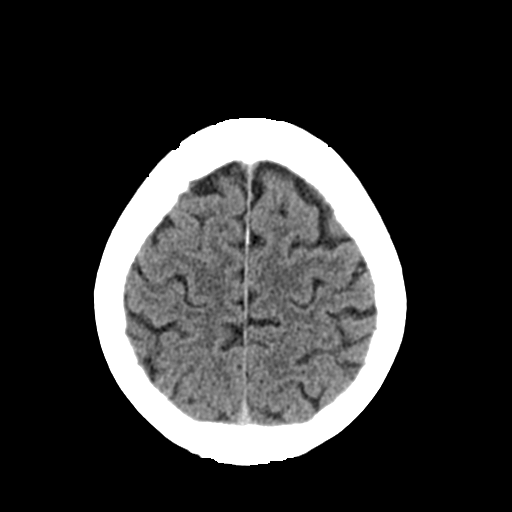
[im 24/28  brain]
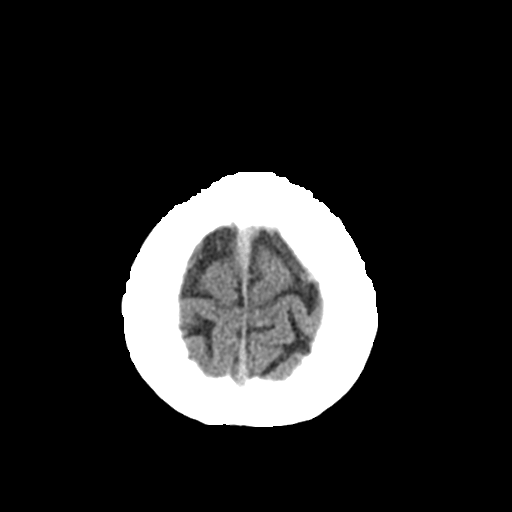
[im 24/28  bone]
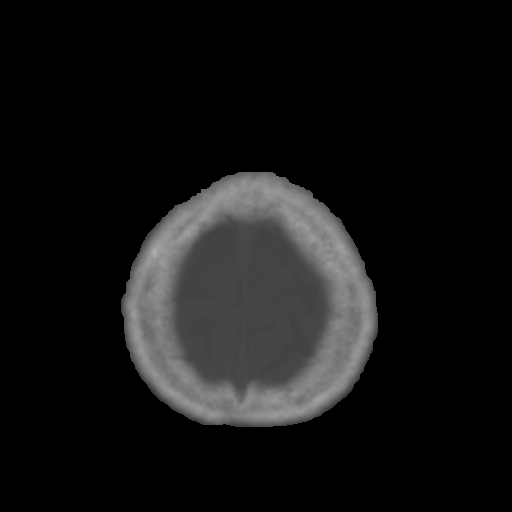
[im 26/28  brain]
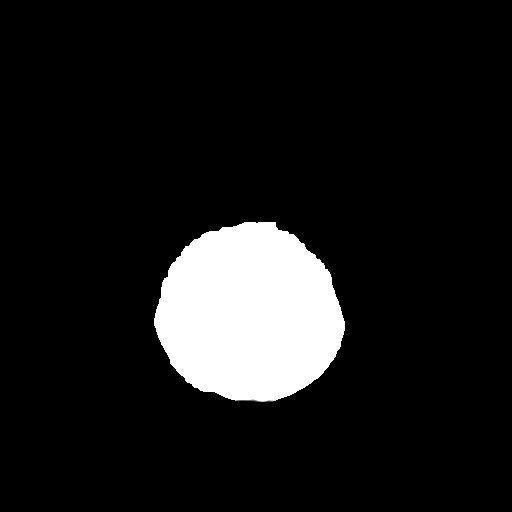

[Series 4: coronal soft tissue · coronal · 0.29mm/px · 3 of 64 slices shown]
[im 22/64  brain]
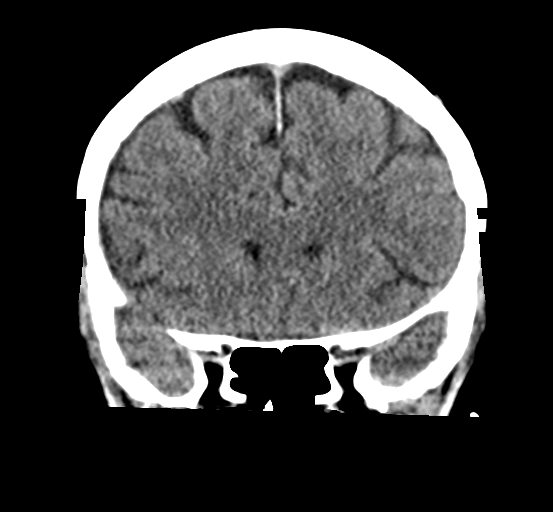
[im 29/64  brain]
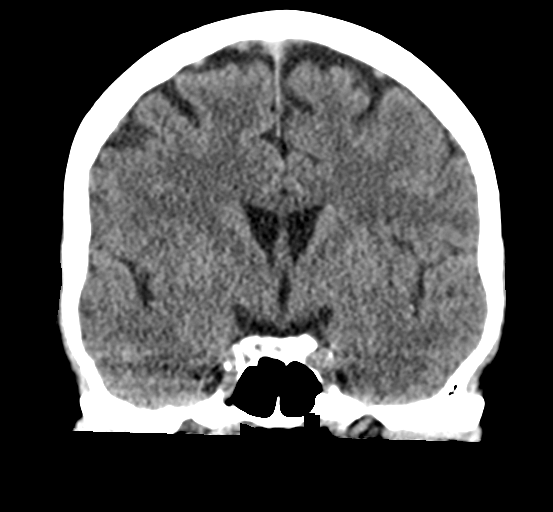
[im 36/64  brain]
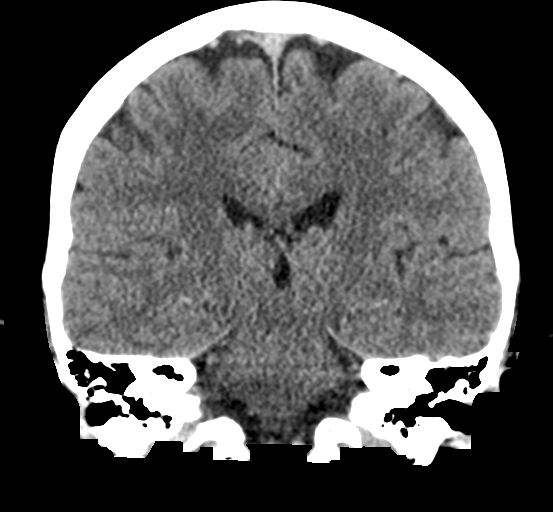

[Series 5: sagittal soft tissue · sagittal · 0.29mm/px · 3 of 47 slices shown]
[im 16/47  brain]
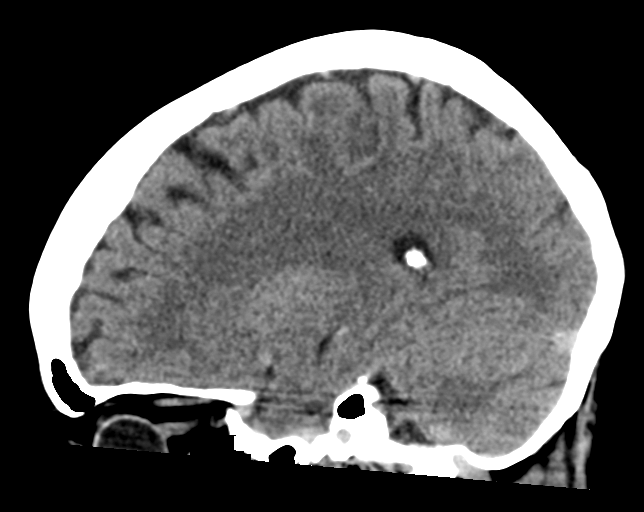
[im 24/47  brain]
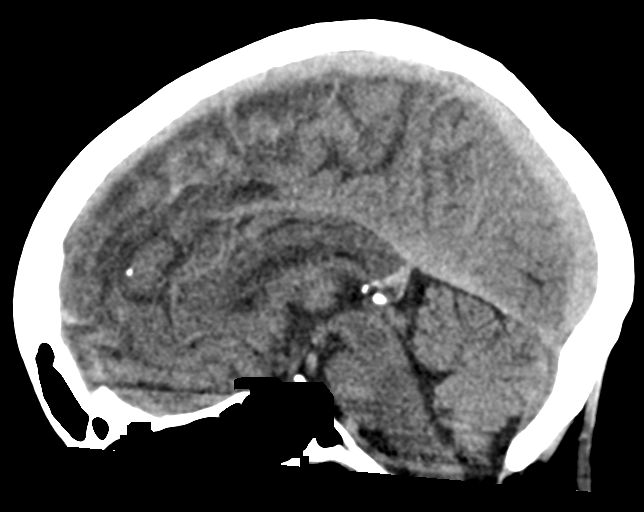
[im 31/47  brain]
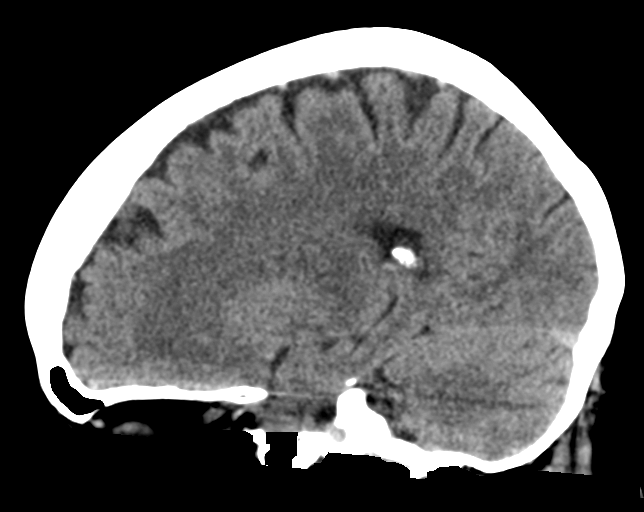

[16 of 45 positions shown; findings below may reference images not displayed]

FINDINGS: Brain: No evidence of acute infarction, hemorrhage, hydrocephalus,
extra-axial collection or mass lesion/mass effect. Symmetric
prominence of the ventricles, cisterns and sulci compatible with
parenchymal volume loss. Patchy areas of white matter
hypoattenuation are most compatible with chronic microvascular
angiopathy.

Vascular: Atherosclerotic calcification of the carotid siphons and
intradural vertebral arteries. No hyperdense vessel.

Skull: No calvarial fracture or suspicious osseous lesion. No scalp
swelling or hematoma.

Sinuses/Orbits: Paranasal sinuses and mastoid air cells are
predominantly clear. Included orbital structures are unremarkable.

Other: Mild bilateral TMJ arthrosis.
IMPRESSION: 1. No acute intracranial findings.
2. Chronic microvascular angiopathy and parenchymal volume loss.

## 2022-06-07 IMAGING — CT CT RENAL STONE PROTOCOL
2 of 4 series · 14 of 46 positions shown, 16 images · non-contrast
Comparison: CT 06/29/2011

CLINICAL DATA: Body aches, vomiting and episodes of syncope for 3
days, denies dysuria or fever

EXAM:
CT ABDOMEN AND PELVIS WITHOUT CONTRAST
TECHNIQUE: Multidetector CT imaging of the abdomen and pelvis was performed
following the standard protocol without IV contrast.

[Series 2: stone full standard · axial · 0.71mm/px · z∈[-818,-428]mm · 11 of 90 slices shown, 13 images]
[im 8/90  soft-tissue]
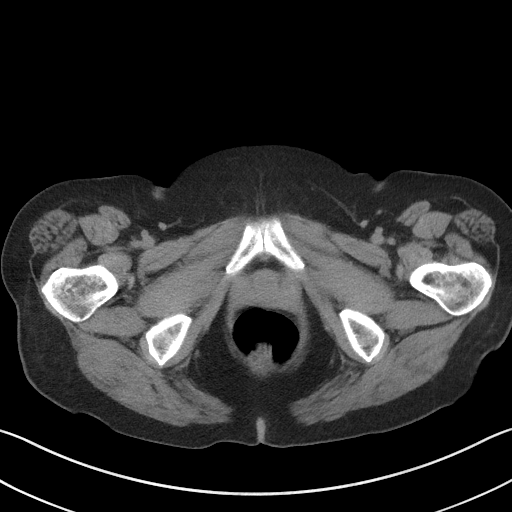
[im 8/90  bone]
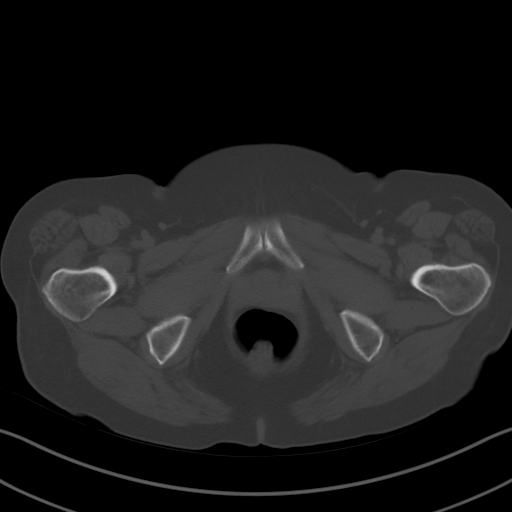
[im 16/90  soft-tissue]
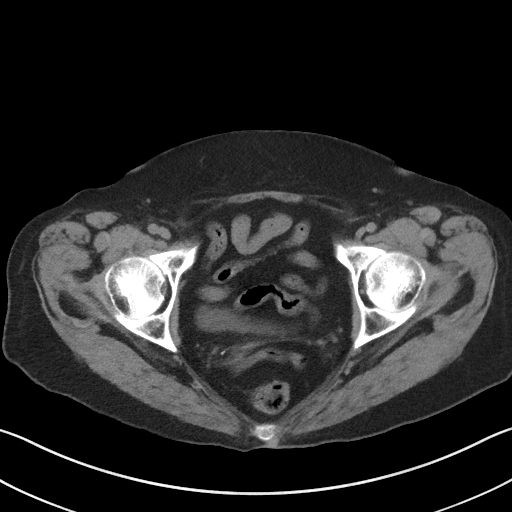
[im 24/90  soft-tissue]
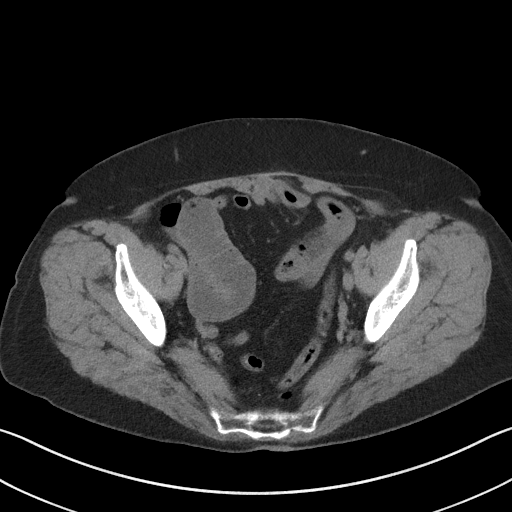
[im 31/90  soft-tissue]
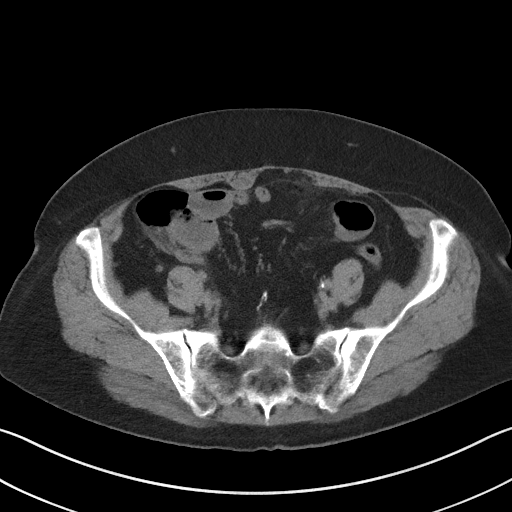
[im 39/90  soft-tissue]
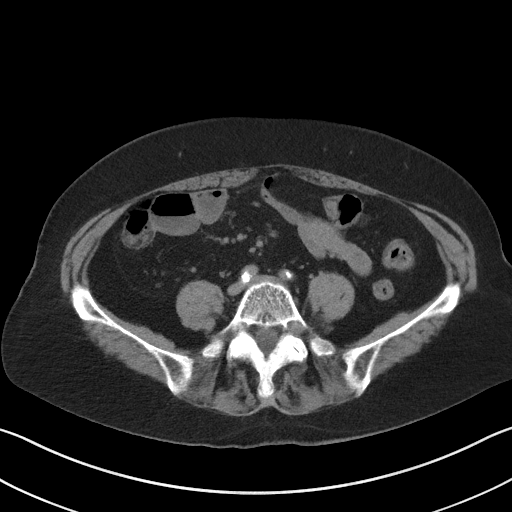
[im 47/90  soft-tissue]
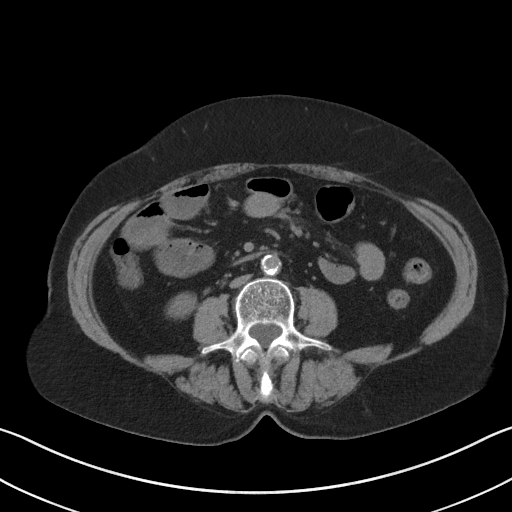
[im 55/90  soft-tissue]
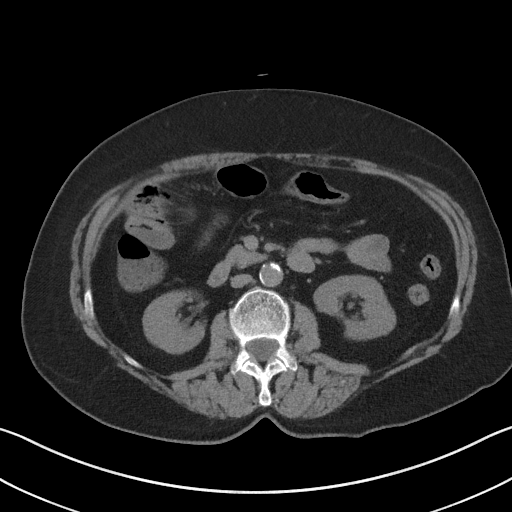
[im 62/90  soft-tissue]
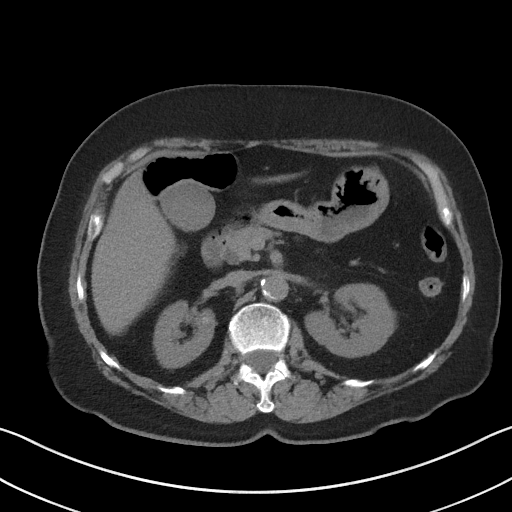
[im 70/90  soft-tissue]
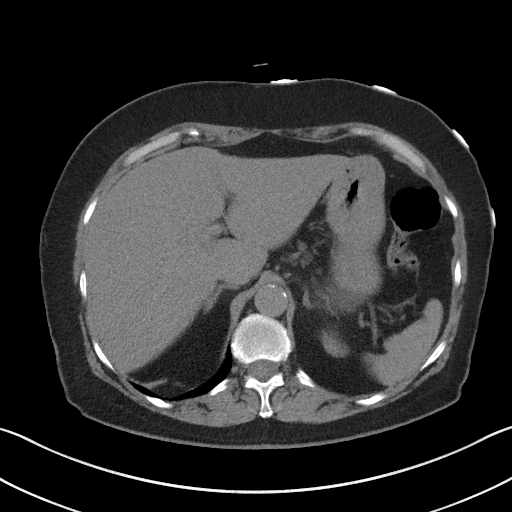
[im 70/90  bone]
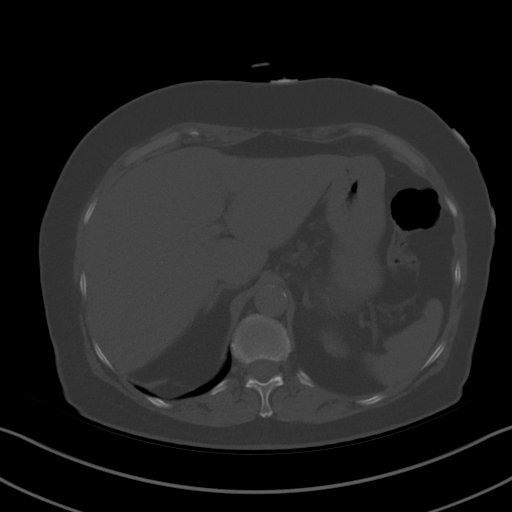
[im 78/90  soft-tissue]
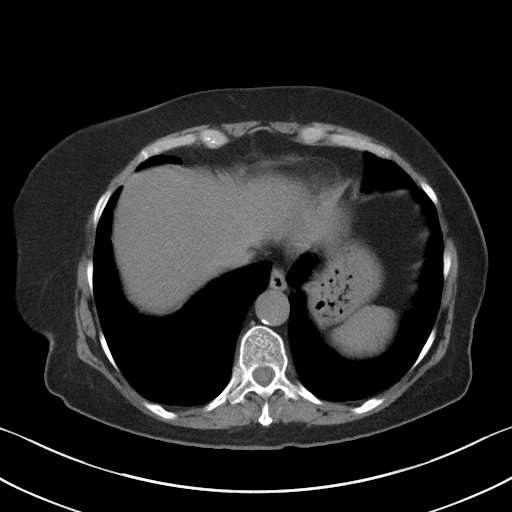
[im 86/90  soft-tissue]
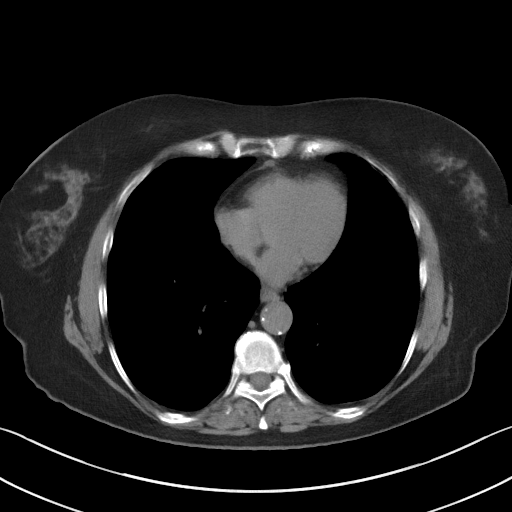

[Series 5: coronal · coronal · 0.66mm/px · 3 of 115 slices shown]
[im 39/115  soft-tissue]
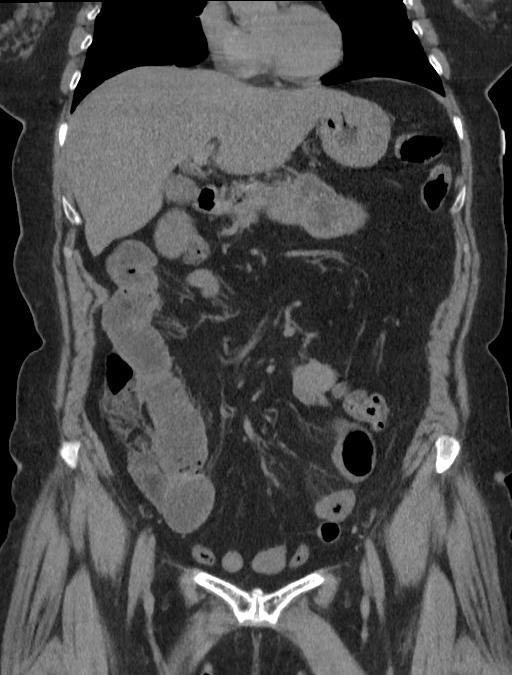
[im 51/115  soft-tissue]
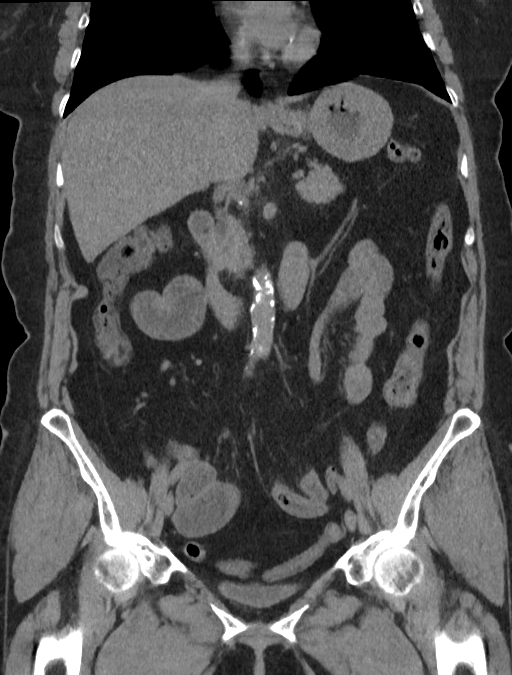
[im 64/115  soft-tissue]
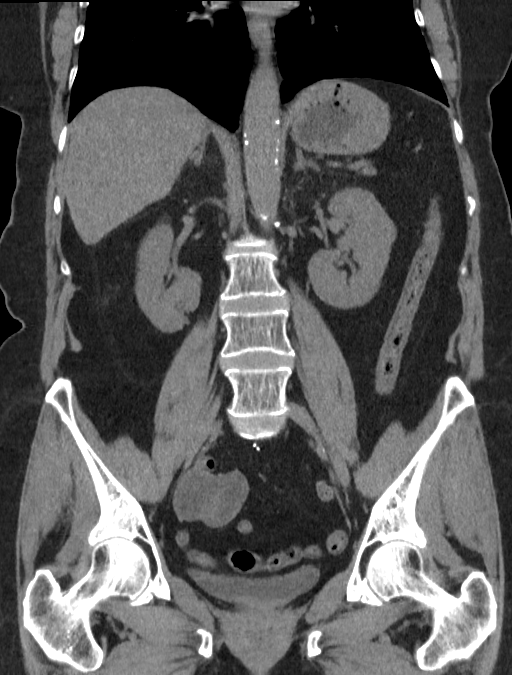

[14 of 46 positions shown; findings below may reference images not displayed]

FINDINGS: Lower chest: Extensive respiratory motion artifact in the lung
bases. Some coarsened interstitial changes and reticular areas of
scarring are similar to comparison from [DATE]. Lung bases are
otherwise clear. Small fat containing right Bochdalek's hernia.
Normal heart size. No pericardial effusion. Few calcifications on
the aortic leaflets and mitral annulus. Coronary artery
calcifications as well. Minimal pericardial thickening anteriorly.

Hepatobiliary: No visible concerning liver lesions. Normal liver
attenuation. Smooth liver surface contour. Gradient attenuation
within the gallbladder, could reflect biliary sludge. No
pericholecystic fluid or inflammation. No frank biliary ductal
dilatation or visible intraductal gallstones.

Pancreas: Unremarkable. No pancreatic ductal dilatation or
surrounding inflammatory changes.

Spleen: Normal in size. No concerning splenic lesions.

Adrenals/Urinary Tract: Normal adrenal glands. Kidneys are symmetric
in size and normally located. Few areas of cortical scarring noted
bilaterally. No visible concerning renal lesions. No urolithiasis or
hydronephrosis. Suspect a small vascular calcifications seen in the
upper pole right kidney. Urinary bladder is largely decompressed at
the time of exam and therefore poorly evaluated by CT imaging. No
gross bladder abnormality accounting for underdistention.

Stomach/Bowel: Distal esophagus is unremarkable. Some mild gastric
thickening likely related to underdistention. Duodenum crosses the
midline abdomen normally. Multiple air and fluid-filled loops of
small bowel with mural thickening including several loops which are
borderline distended up to 3.2 cm. Transitions to the dilated
segment seen in the midline abdomen ([DATE]) and right upper quadrant
(2/38). Some associated hazy mesenteric stranding is noted as well.
There is intramural fat within the cecum and ascending colon. Some
mild edematous thickening of the colonic wall is also noted. Few
noninflamed colonic diverticula. Colorectal anastomosis in the low
midline pelvis ([DATE]) appears patent.

Vascular/Lymphatic: Atherosclerotic calcifications within the
abdominal aorta and branch vessels. No aneurysm or ectasia. No
enlarged abdominopelvic lymph nodes. Some reactive mesenteric nodes
are present with hazy mesenteric stranding.

Reproductive: Uterus appears surgically absent. No concerning
adnexal lesions.

Other: No abdominopelvic free fluid or free gas. No bowel containing
hernias. Small fat containing left femoral hernia (2/69).

Musculoskeletal: The osseous structures appear diffusely
demineralized which may limit detection of small or nondisplaced
fractures. No acute osseous abnormality or suspicious osseous
lesion. Multilevel degenerative changes are present in the imaged
portions of the spine. Features most pronounced L5-S1. Additional
mild degenerative changes in the hips and pelvis.
IMPRESSION: 1. Multiple air and fluid-filled loops of small bowel with mild
mural thickening. Additional mild edematous mural thickening of the
colon as well. Features are concerning for an acute infectious or
inflammatory enterocolitis.
2. Segmental thickening of several loops in the right hemiabdomen
with transition points in the upper midline abdomen and right upper
quadrant. Could reflect a focal ileus or partial obstruction.
3. Intramural fat of the cecum and ascending colon could reflect
sequela of prior inflammatory change.
4. Gradient attenuation within the gallbladder, could reflect
biliary sludge. No pericholecystic fluid or inflammation. Correlate
with abdominal symptoms and if there is further clinical concern,
right upper quadrant ultrasound could be obtained.
5. Aortic Atherosclerosis ([id number].W).

## 2022-06-28 ENCOUNTER — Other Ambulatory Visit: Payer: Self-pay

## 2022-06-28 ENCOUNTER — Other Ambulatory Visit: Payer: Medicare Other

## 2022-06-28 DIAGNOSIS — R7989 Other specified abnormal findings of blood chemistry: Secondary | ICD-10-CM

## 2022-06-28 DIAGNOSIS — E7800 Pure hypercholesterolemia, unspecified: Secondary | ICD-10-CM

## 2022-06-28 DIAGNOSIS — R7303 Prediabetes: Secondary | ICD-10-CM

## 2022-06-29 ENCOUNTER — Other Ambulatory Visit: Payer: Medicare Other

## 2022-06-29 DIAGNOSIS — E7800 Pure hypercholesterolemia, unspecified: Secondary | ICD-10-CM | POA: Diagnosis not present

## 2022-06-29 DIAGNOSIS — R7989 Other specified abnormal findings of blood chemistry: Secondary | ICD-10-CM | POA: Diagnosis not present

## 2022-06-29 DIAGNOSIS — R7303 Prediabetes: Secondary | ICD-10-CM | POA: Diagnosis not present

## 2022-06-30 LAB — LIPID PANEL
Cholesterol: 183 mg/dL (ref <200)
HDL: 60 mg/dL (ref >=50)
LDL Cholesterol (Calc): 101 mg/dL (calc) — ABNORMAL HIGH
Non-HDL Cholesterol (Calc): 123 mg/dL (calc) (ref <130)
Total CHOL/HDL Ratio: 3.1 (calc) (ref <5.0)
Triglycerides: 119 mg/dL (ref <150)

## 2022-06-30 LAB — COMPLETE METABOLIC PANEL WITH GFR
AG Ratio: 2 (calc) (ref 1.0–2.5)
ALT: 86 U/L — ABNORMAL HIGH (ref 6–29)
AST: 66 U/L — ABNORMAL HIGH (ref 10–35)
Albumin: 4.5 g/dL (ref 3.6–5.1)
Alkaline phosphatase (APISO): 82 U/L (ref 37–153)
BUN: 10 mg/dL (ref 7–25)
CO2: 28 mmol/L (ref 20–32)
Calcium: 9.6 mg/dL (ref 8.6–10.4)
Chloride: 102 mmol/L (ref 98–110)
Creat: 0.65 mg/dL (ref 0.60–1.00)
Globulin: 2.3 g/dL (calc) (ref 1.9–3.7)
Glucose, Bld: 102 mg/dL — ABNORMAL HIGH (ref 65–99)
Potassium: 4.2 mmol/L (ref 3.5–5.3)
Sodium: 142 mmol/L (ref 135–146)
Total Bilirubin: 0.5 mg/dL (ref 0.2–1.2)
Total Protein: 6.8 g/dL (ref 6.1–8.1)
eGFR: 93 mL/min/{1.73_m2} (ref >=60)

## 2022-06-30 LAB — HEMOGLOBIN A1C
Hgb A1c MFr Bld: 5.8 % of total Hgb — ABNORMAL HIGH (ref <5.7)
Mean Plasma Glucose: 120 mg/dL
eAG (mmol/L): 6.6 mmol/L

## 2022-07-05 ENCOUNTER — Ambulatory Visit: Payer: Medicare Other | Admitting: Family Medicine

## 2022-07-07 ENCOUNTER — Encounter: Payer: Self-pay | Admitting: Family Medicine

## 2022-07-07 ENCOUNTER — Ambulatory Visit (INDEPENDENT_AMBULATORY_CARE_PROVIDER_SITE_OTHER): Payer: Medicare Other | Admitting: Family Medicine

## 2022-07-07 VITALS — BP 134/49 | HR 70 | Ht 65.0 in | Wt 161.0 lb

## 2022-07-07 DIAGNOSIS — Z23 Encounter for immunization: Secondary | ICD-10-CM

## 2022-07-07 DIAGNOSIS — E7800 Pure hypercholesterolemia, unspecified: Secondary | ICD-10-CM | POA: Diagnosis not present

## 2022-07-07 DIAGNOSIS — R7303 Prediabetes: Secondary | ICD-10-CM

## 2022-07-07 NOTE — Progress Notes (Signed)
Subjective:    Patient ID: Stacy Schmidt, female    DOB: 07-03-1951, 71 y.o.   MRN: [id number]  Stacy Schmidt is a 71 y.o. female presenting on 07/07/2022 for Prediabetes and Hyperlipidemia   HPI   LDCT Was told had pulm nodule, at [place], she does not have the report. Here today with copy of last result on LDCT 12/22/20 - with benign appearance, previous lung nodules new on 12/27/19 have resolved. [facility], but patients prefers to pause on repeat screening.   Elevated LFTs Improved on last lab   Hyperlipidemia Improved on last lab LDL 101 with lifestyle  Hypothyroidism Taking Levothyroxine 112 mcg daily   Pre-Diabetes Weight was down to 140s, but gained back to 160s A1c 5.8 improved  Vinia Diet plan has done well. Improves circulation and blood flow Helps lower cholesterol   Osteoarthritis Knees She is doing stationary bike exercises. She has been evaluated and they told her it was no problem. She has tried topical medication. She has limitation with prolonged walking can cause worsening soreness in knees. Improved on Voltaren   GERD Chronic problem, on PPI Omeprazole 16m daily.   BPPV Cerumen Reports occasional brief dizziness spells with quick turning.   Varicose veins     Health Maintenance: UTD Flu Shot.   Shingrix last dose 12/16/20, need 2nd dose.   Declines Mammogram screening, last done 5-10 years. Declines mammogram.   [place] she was advised to return in 3-4 years. [place] [facility]      01/04/2022    2:29 PM 12/30/2021    3:03 PM 11/18/2021   10:39 AM  Depression screen PHQ 2/9  Decreased Interest 0 0 0  Down, Depressed, Hopeless 0 0 0  PHQ - 2 Score 0 0 0  Altered sleeping 0 0   Tired, decreased energy 0 3   Change in appetite 0 0   Feeling bad or failure about yourself  0 0   Trouble concentrating 0 0   Moving slowly or fidgety/restless 0 0    Suicidal thoughts 0 0   PHQ-9 Score 0 3   Difficult doing work/chores Not difficult at all Not difficult at all     Social History   Tobacco Use   Smoking status: Former    Packs/day: 1.00    Years: 50.00    Total pack years: 50.00    Types: Cigarettes   Smokeless tobacco: Never  Vaping Use   Vaping Use: Never used  Substance Use Topics   Alcohol use: Not Currently    Review of Systems Per HPI unless specifically indicated above     Objective:    BP (!) 134/49   Pulse 70   Ht 5' 5"  (1.651 m)   Wt 161 lb (73 kg)   SpO2 96%   BMI 26.79 kg/m   Wt Readings from Last 3 Encounters:  07/07/22 161 lb (73 kg)  12/30/21 165 lb (74.8 kg)  11/18/21 164 lb 9.6 oz (74.7 kg)    Physical Exam Vitals and nursing note reviewed.  Constitutional:      General: She is not in acute distress.    Appearance: Normal appearance. She is well-developed. She is not diaphoretic.     Comments: Well-appearing, comfortable, cooperative  HENT:     Head: Normocephalic and atraumatic.  Eyes:     General:        Right eye:  No discharge.        Left eye: No discharge.     Conjunctiva/sclera: Conjunctivae normal.  Cardiovascular:     Rate and Rhythm: Normal rate.  Pulmonary:     Effort: Pulmonary effort is normal.  Skin:    General: Skin is warm and dry.     Findings: No erythema or rash.  Neurological:     Mental Status: She is alert and oriented to person, place, and time.  Psychiatric:        Mood and Affect: Mood normal.        Behavior: Behavior normal.        Thought Content: Thought content normal.     Comments: Well groomed, good eye contact, normal speech and thoughts     Results for orders placed or performed in visit on 06/28/22  Hemoglobin A1c  Result Value Ref Range   Hgb A1c MFr Bld 5.8 (H) <5.7 % of total Hgb   Mean Plasma Glucose 120 mg/dL   eAG (mmol/L) 6.6 mmol/L  Lipid panel  Result Value Ref Range   Cholesterol 183 <200 mg/dL   HDL 60 > OR = 50 mg/dL    Triglycerides 119 <150 mg/dL   LDL Cholesterol (Calc) 101 (H) mg/dL (calc)   Total CHOL/HDL Ratio 3.1 <5.0 (calc)   Non-HDL Cholesterol (Calc) 123 <130 mg/dL (calc)  COMPLETE METABOLIC PANEL WITH GFR  Result Value Ref Range   Glucose, Bld 102 (H) 65 - 99 mg/dL   BUN 10 7 - 25 mg/dL   Creat 0.65 0.60 - 1.00 mg/dL   eGFR 93 > OR = 60 mL/min/1.30m   BUN/Creatinine Ratio SEE NOTE: 6 - 22 (calc)   Sodium 142 135 - 146 mmol/L   Potassium 4.2 3.5 - 5.3 mmol/L   Chloride 102 98 - 110 mmol/L   CO2 28 20 - 32 mmol/L   Calcium 9.6 8.6 - 10.4 mg/dL   Total Protein 6.8 6.1 - 8.1 g/dL   Albumin 4.5 3.6 - 5.1 g/dL   Globulin 2.3 1.9 - 3.7 g/dL (calc)   AG Ratio 2.0 1.0 - 2.5 (calc)   Total Bilirubin 0.5 0.2 - 1.2 mg/dL   Alkaline phosphatase (APISO) 82 37 - 153 U/L   AST 66 (H) 10 - 35 U/L   ALT 86 (H) 6 - 29 U/L      Assessment & Plan:   Problem List Items Addressed This Visit     Pre-diabetes    Well-controlled Pre-DM with A1c 5.8  Plan:  1. Not on any therapy currently  2. Encourage improved lifestyle - low carb, low sugar diet, reduce portion size, continue improving regular exercise       Pure hypercholesterolemia - Primary    Controlled cholesterol with lifestyle, not on statin The 10-year ASCVD risk score (Arnett DK, et al., 2019) is: 12.4%  Plan: 1. Discussion on ASCVD risk reduction, consider in future  2. Encourage improved lifestyle - low carb/cholesterol, reduce portion size, continue improving regular exercise      Other Visit Diagnoses     Needs flu shot       Relevant Orders   Flu Vaccine QUAD High Dose(Fluad) (Completed)          Orders Placed This Encounter  Procedures   Flu Vaccine QUAD High Dose(Fluad)     No orders of the defined types were placed in this encounter.     Follow up plan: Return in about 6 months (around 01/05/2023) for 6  month fasting lab only then 1 week later Annual Physical.  Future labs ordered for  CMET Lipid CBC A1c  TSH T4   Nobie Putnam, DO [facility] 07/07/2022, 3:04 PM

## 2022-07-07 NOTE — Patient Instructions (Addendum)
Thank you for coming to the office today.  Great job overall with your lifestyle changes. All results have improved.  Recent Labs    12/23/21 0813 06/29/22 0759  HGBA1C 6.1* 5.8*   Lipid Panel     Component Value Date/Time   CHOL 183 06/29/2022 0759   TRIG 119 06/29/2022 0759   HDL 60 06/29/2022 0759   CHOLHDL 3.1 06/29/2022 0759   LDLCALC 101 (H) 06/29/2022 0759   LDL down to 101, from previous 148  DUE for FASTING BLOOD WORK (no food or drink after midnight before the lab appointment, only water or coffee without cream/sugar on the morning of)  SCHEDULE "Lab Only" visit in the morning at the clinic for lab draw in 6 MONTHS   - Make sure Lab Only appointment is at about 1 week before your next appointment, so that results will be available  For Lab Results, once available within 2-3 days of blood draw, you can can log in to MyChart online to view your results and a brief explanation. Also, we can discuss results at next follow-up visit.    Please schedule a Follow-up Appointment to: Return in about 6 months (around 01/05/2023) for 6 month fasting lab only then 1 week later Annual Physical.  If you have any other questions or concerns, please feel free to call the office or send a message through [place]. You may also schedule an earlier appointment if necessary.  Additionally, you may be receiving a survey about your experience at our office within a few days to 1 week by e-mail or mail. We value your feedback.  Nobie Putnam, DO [place]

## 2022-07-09 ENCOUNTER — Other Ambulatory Visit: Payer: Self-pay | Admitting: Family Medicine

## 2022-07-09 DIAGNOSIS — Z0000 Encounter for general adult medical examination without abnormal findings: Secondary | ICD-10-CM

## 2022-07-09 DIAGNOSIS — E7800 Pure hypercholesterolemia, unspecified: Secondary | ICD-10-CM

## 2022-07-09 DIAGNOSIS — E039 Hypothyroidism, unspecified: Secondary | ICD-10-CM

## 2022-07-09 DIAGNOSIS — R7303 Prediabetes: Secondary | ICD-10-CM

## 2022-07-09 NOTE — Assessment & Plan Note (Signed)
Controlled cholesterol with lifestyle, not on statin The 10-year ASCVD risk score (Arnett DK, et al., 2019) is: 12.4%  Plan: 1. Discussion on ASCVD risk reduction, consider in future  2. Encourage improved lifestyle - low carb/cholesterol, reduce portion size, continue improving regular exercise

## 2022-07-09 NOTE — Assessment & Plan Note (Signed)
Well-controlled Pre-DM with A1c 5.8  Plan:  1. Not on any therapy currently  2. Encourage improved lifestyle - low carb, low sugar diet, reduce portion size, continue improving regular exercise

## 2022-09-15 ENCOUNTER — Telehealth: Payer: Self-pay | Admitting: *Deleted

## 2022-09-15 NOTE — Patient Instructions (Signed)
Visit Information  Thank you for taking time to visit with me today. Please don't hesitate to contact me if I can be of assistance to you before our next scheduled telephone appointment.  Following are the goals we discussed today:  Keep taking Vinia for vascular issues. Monitor blood pressure several times a week.  Our next appointment is by telephone on 2/19  Please call the care guide team at [phone] if you need to cancel or reschedule your appointment.   Please call the Suicide and Crisis Lifeline: 988 call the [place] National Suicide Prevention Lifeline: 281-643-5735 or TTY: (223)358-2707 TTY (585) 360-4222) to talk to a trained counselor call 1-800-273-TALK (toll free, 24 hour hotline) call 911 if you are experiencing a Mental Health or Behavioral Health Crisis or need someone to talk to.  Patient verbalizes understanding of instructions and care plan provided today and agrees to view in MyChart. Active MyChart status and patient understanding of how to access instructions and care plan via MyChart confirmed with patient.     The patient has been provided with contact information for the care management team and has been advised to call with any health related questions or concerns.   Kemper Durie, RN, MSN, [facility] Care Management Care Management Coordinator [phone]

## 2022-09-15 NOTE — Patient Outreach (Signed)
  Care Coordination   Follow Up Visit Note   09/15/2022 Name: ALANEA WOOLRIDGE MRN: [id number] DOB: 1951-03-09  Agness A Gao is a 71 y.o. year old female who sees [name], DO for primary care. I spoke with  Azari A Riordan by phone today.  What matters to the patients health and wellness today?  Report vascular issue is better, continue to work on exercise and weight loss.    Goals Addressed             This Visit's Progress    RNCM: Effective Management of Chronic conditions   On track    Care Coordination Interventions: Evaluation of current treatment plan related to pre-DM and her leg turning dark at the ankle and patient's adherence to plan as established by provider Advised patient to provide appropriate vaccination information to provider or CM team member at next visit Advised patient to Talk to the pcp about possible circulation concerns in bilateral legs if they continue.  Currently state she has been taking Vinia, which has helped tremendously Reviewed medications with patient and discussed compliance with medications  Provided patient with weight loss concerns, circulation concerns,  educational materials related to weight loss and circulation concerns Reviewed scheduled/upcoming provider appointments including 3/25 with PCP Discussed plans with patient for ongoing care management follow up and provided patient with direct contact information for care management team Advised patient to discuss questions and concerns about chronic conditions  with provider Does not monitor blood pressure, encouraged to do so Has had episodes of dizziness, relieved with Meclizine            SDOH assessments and interventions completed:  No     Care Coordination Interventions:  Yes, provided   Follow up plan: Follow up call scheduled for 2/19    Encounter Outcome:  Pt. Visit Completed    Kemper Durie, RN, MSN, [facility] Care Management Care Management  Coordinator [phone]

## 2022-09-16 ENCOUNTER — Encounter: Payer: Self-pay | Admitting: *Deleted

## 2022-12-06 ENCOUNTER — Ambulatory Visit: Payer: Self-pay | Admitting: *Deleted

## 2022-12-06 NOTE — Patient Outreach (Signed)
  Care Coordination   Follow Up Visit Note   12/06/2022 Name: Stacy Schmidt MRN: [id number] DOB: 1951-02-13  Stacy Schmidt is a 72 y.o. year old female who sees Olin Hauser, DO for primary care. I spoke with  Tony A Hattery by phone today.  What matters to the patients health and wellness today?  Ongoing weight management, increasing exercise for overall wellness.  Denies any urgent concerns, encouraged to contact this care manager with questions.      Goals Addressed             This Visit's Progress    COMPLETED: RNCM: Effective Management of Chronic conditions   On track    Care Coordination Interventions: Evaluation of current treatment plan related to pre-DM and her leg turning dark at the ankle and patient's adherence to plan as established by provider Advised patient to provide appropriate vaccination information to provider or CM team member at next visit Advised patient to Talk to the pcp about possible circulation concerns in bilateral legs if they continue.  Currently state she has been taking Vinia, which has helped tremendously Reviewed medications with patient and discussed compliance with medications  Provided patient with weight loss concerns, circulation concerns,  educational materials related to weight loss and circulation concerns Reviewed scheduled/upcoming provider appointments including 3/25 with PCP Discussed plans with patient for ongoing care management follow up and provided patient with direct contact information for care management team Advised patient to discuss questions and concerns about chronic conditions  with provider Does not monitor blood pressure, encouraged to do so Has had episodes of dizziness, relieved with Meclizine, none recently           SDOH assessments and interventions completed:  No     Care Coordination Interventions:  Yes, provided   Follow up plan: No further intervention required.   Encounter  Outcome:  Pt. Visit Completed   Valente David, RN, MSN, [place] [facility] Care Management Care Management Coordinator [phone]

## 2022-12-06 NOTE — Patient Instructions (Signed)
Visit Information  Thank you for taking time to visit with me today. Please don't hesitate to contact me if I can be of assistance to you.  Following are the goals we discussed today:  Continue exercise and diet plan for weight management.  Monitor weight and blood pressure 2-3 times a week.  Please call the Suicide and Crisis Lifeline: 988 call the [place] National Suicide Prevention Lifeline: 9590898151 or TTY: 432-141-9357 TTY (520)254-2932) to talk to a trained counselor call 1-800-273-TALK (toll free, 24 hour hotline) call 911 if you are experiencing a Mental Health or [place] or need someone to talk to.  Patient verbalizes understanding of instructions and care plan provided today and agrees to view in [place]. Active MyChart status and patient understanding of how to access instructions and care plan via MyChart confirmed with patient.     The patient has been provided with contact information for the care management team and has been advised to call with any health related questions or concerns.   [place] Management Care Management Coordinator [phone]

## 2022-12-17 ENCOUNTER — Other Ambulatory Visit: Payer: Self-pay | Admitting: Family Medicine

## 2022-12-17 DIAGNOSIS — E039 Hypothyroidism, unspecified: Secondary | ICD-10-CM

## 2022-12-17 NOTE — Telephone Encounter (Signed)
Medication Refill - Medication: levothyroxine (SYNTHROID) 100 MCG tablet   Has the patient contacted their pharmacy? No.  Pt states that she has 3 pills left. Pt is wanting to know if she can get a short supply until she has her appointment on 01/10/23.   Pt is wanting to know if she will be ok if she does not take the medication until her appointment.   Preferred Pharmacy (with phone number or street name):  [facility] [place] - [address] Phone: [phone]  Fax: [phone]     Has the patient been seen for an appointment in the last year OR does the patient have an upcoming appointment? Yes.    Agent: Please be advised that RX refills may take up to 3 business days. We ask that you follow-up with your pharmacy.

## 2022-12-20 MED ORDER — LEVOTHYROXINE SODIUM 100 MCG PO TABS: 100.0000 ug | ORAL_TABLET | Freq: Every day | ORAL | 0 refills | Status: DC

## 2022-12-20 NOTE — Telephone Encounter (Signed)
Requested Prescriptions  Pending Prescriptions Disp Refills   levothyroxine (SYNTHROID) 100 MCG tablet 90 tablet 0    Sig: Take 1 tablet (100 mcg total) by mouth daily before breakfast.     Endocrinology:  Hypothyroid Agents Passed - 12/17/2022  2:18 PM      Passed - TSH in normal range and within 360 days    TSH  Date Value Ref Range Status  12/23/2021 2.18 0.40 - 4.50 mIU/L Final         Passed - Valid encounter within last 12 months    Recent Outpatient Visits           5 months ago Pure hypercholesterolemia   [facility] Olin Hauser, DO   11 months ago Annual physical exam   [facility] Olin Hauser, DO   1 year ago Acquired hypothyroidism   [place], DO       Future Appointments             In 3 weeks [place], Devonne Doughty, DO [facility], [facility]

## 2022-12-21 ENCOUNTER — Telehealth: Payer: Self-pay | Admitting: Family Medicine

## 2022-12-21 NOTE — Telephone Encounter (Signed)
Contacted Stacy Schmidt to schedule their annual wellness visit. Appointment made for 01/06/2023.  Sherol Dade; Care Guide Ambulatory Clinical [facility] Direct Dial: [phone]

## 2022-12-31 ENCOUNTER — Other Ambulatory Visit: Payer: Self-pay

## 2022-12-31 DIAGNOSIS — E039 Hypothyroidism, unspecified: Secondary | ICD-10-CM

## 2022-12-31 DIAGNOSIS — E7800 Pure hypercholesterolemia, unspecified: Secondary | ICD-10-CM

## 2022-12-31 DIAGNOSIS — R7303 Prediabetes: Secondary | ICD-10-CM

## 2022-12-31 DIAGNOSIS — Z0000 Encounter for general adult medical examination without abnormal findings: Secondary | ICD-10-CM

## 2023-01-03 ENCOUNTER — Other Ambulatory Visit: Payer: Medicare Other

## 2023-01-03 DIAGNOSIS — E039 Hypothyroidism, unspecified: Secondary | ICD-10-CM | POA: Diagnosis not present

## 2023-01-03 DIAGNOSIS — R7303 Prediabetes: Secondary | ICD-10-CM | POA: Diagnosis not present

## 2023-01-03 DIAGNOSIS — E7800 Pure hypercholesterolemia, unspecified: Secondary | ICD-10-CM | POA: Diagnosis not present

## 2023-01-03 DIAGNOSIS — Z0000 Encounter for general adult medical examination without abnormal findings: Secondary | ICD-10-CM | POA: Diagnosis not present

## 2023-01-04 LAB — CBC WITH DIFFERENTIAL/PLATELET
Absolute Monocytes: 659 cells/uL (ref 200–950)
Basophils Absolute: 43 cells/uL (ref 0–200)
Basophils Relative: 0.7 %
Eosinophils Absolute: 110 cells/uL (ref 15–500)
Eosinophils Relative: 1.8 %
HCT: 39.3 % (ref 35.0–45.0)
Hemoglobin: 13.1 g/dL (ref 11.7–15.5)
Lymphs Abs: 2410 cells/uL (ref 850–3900)
MCH: 30.6 pg (ref 27.0–33.0)
MCHC: 33.3 g/dL (ref 32.0–36.0)
MCV: 91.8 fL (ref 80.0–100.0)
MPV: 9.7 fL (ref 7.5–12.5)
Monocytes Relative: 10.8 %
Neutro Abs: 2879 cells/uL (ref 1500–7800)
Neutrophils Relative %: 47.2 %
Platelets: 291 10*3/uL (ref 140–400)
RBC: 4.28 10*6/uL (ref 3.80–5.10)
RDW: 11.8 % (ref 11.0–15.0)
Total Lymphocyte: 39.5 %
WBC: 6.1 10*3/uL (ref 3.8–10.8)

## 2023-01-04 LAB — LIPID PANEL
Cholesterol: 196 mg/dL (ref <200)
HDL: 56 mg/dL (ref >=50)
LDL Cholesterol (Calc): 116 mg/dL (calc) — ABNORMAL HIGH
Non-HDL Cholesterol (Calc): 140 mg/dL (calc) — ABNORMAL HIGH (ref <130)
Total CHOL/HDL Ratio: 3.5 (calc) (ref <5.0)
Triglycerides: 125 mg/dL (ref <150)

## 2023-01-04 LAB — COMPLETE METABOLIC PANEL WITH GFR
AG Ratio: 2.2 (calc) (ref 1.0–2.5)
ALT: 128 U/L — ABNORMAL HIGH (ref 6–29)
AST: 74 U/L — ABNORMAL HIGH (ref 10–35)
Albumin: 4.6 g/dL (ref 3.6–5.1)
Alkaline phosphatase (APISO): 84 U/L (ref 37–153)
BUN: 15 mg/dL (ref 7–25)
CO2: 30 mmol/L (ref 20–32)
Calcium: 9.7 mg/dL (ref 8.6–10.4)
Chloride: 104 mmol/L (ref 98–110)
Creat: 0.6 mg/dL (ref 0.60–1.00)
Globulin: 2.1 g/dL (calc) (ref 1.9–3.7)
Glucose, Bld: 106 mg/dL — ABNORMAL HIGH (ref 65–99)
Potassium: 4.4 mmol/L (ref 3.5–5.3)
Sodium: 141 mmol/L (ref 135–146)
Total Bilirubin: 0.6 mg/dL (ref 0.2–1.2)
Total Protein: 6.7 g/dL (ref 6.1–8.1)
eGFR: 95 mL/min/{1.73_m2} (ref >=60)

## 2023-01-04 LAB — T4, FREE: Free T4: 1.2 ng/dL (ref 0.8–1.8)

## 2023-01-04 LAB — HEMOGLOBIN A1C
Hgb A1c MFr Bld: 5.9 % of total Hgb — ABNORMAL HIGH (ref <5.7)
Mean Plasma Glucose: 123 mg/dL
eAG (mmol/L): 6.8 mmol/L

## 2023-01-04 LAB — TSH: TSH: 0.69 mIU/L (ref 0.40–4.50)

## 2023-01-06 ENCOUNTER — Ambulatory Visit (INDEPENDENT_AMBULATORY_CARE_PROVIDER_SITE_OTHER): Payer: 59

## 2023-01-06 VITALS — Ht 65.0 in | Wt 161.0 lb

## 2023-01-06 DIAGNOSIS — Z0000 Encounter for general adult medical examination without abnormal findings: Secondary | ICD-10-CM

## 2023-01-06 DIAGNOSIS — Z122 Encounter for screening for malignant neoplasm of respiratory organs: Secondary | ICD-10-CM

## 2023-01-06 NOTE — Progress Notes (Signed)
I connected with  Stacy Schmidt on 01/06/23 by a audio enabled telemedicine application and verified that I am speaking with the correct person using two identifiers.  Patient Location: Home  Provider Location: Office/Clinic  I discussed the limitations of evaluation and management by telemedicine. The patient expressed understanding and agreed to proceed.  Subjective:   Stacy Schmidt is a 72 y.o. female who presents for Medicare Annual (Subsequent) preventive examination.  Review of Systems     Cardiac Risk Factors include: advanced age (>50men, >81 women)     Objective:    There were no vitals filed for this visit. There is no height or weight on file to calculate BMI.     01/06/2023    1:32 PM 06/06/2020   12:33 AM 06/04/2020    7:00 PM 06/29/2018   10:16 AM  Advanced Directives  Does Patient Have a Medical Advance Directive? No No No No  Would patient like information on creating a medical advance directive? No - Patient declined No - Patient declined  No - Patient declined    Current Medications (verified) Outpatient Encounter Medications as of 01/06/2023  Medication Sig   Cholecalciferol (VITAMIN D-3) 125 MCG (5000 UT) TABS Take by mouth.   fish oil-omega-3 fatty acids 1000 MG capsule Take 2 g by mouth daily.   levothyroxine (SYNTHROID) 100 MCG tablet Take 1 tablet (100 mcg total) by mouth daily before breakfast.   omeprazole (PRILOSEC) 40 MG capsule Take 1 capsule (40 mg total) by mouth daily before breakfast.   Diclofenac Sodium (VOLTAREN EX) Apply topically. (Patient not taking: Reported on 01/06/2023)   No facility-administered encounter medications on file as of 01/06/2023.    Allergies (verified) Patient has no known allergies.   History: Past Medical History:  Diagnosis Date   Diverticulitis    GERD (gastroesophageal reflux disease)    Hypothyroidism    History reviewed. No pertinent surgical history. Family History  Problem Relation Age of  Onset   Hypertension Mother    Congestive Heart Failure Mother    Hypertension Father    Stroke Father    Cancer Sister    Kidney disease Sister    Kidney cancer Sister 70   Breast cancer Sister    Cancer Brother    Kidney disease Brother    Aneurysm Brother    Lung cancer Brother    Social History   Socioeconomic History   Marital status: Single    Spouse name: Not on file   Number of children: 1   Years of education: Not on file   Highest education level: Not on file  Occupational History   Occupation: retired  Tobacco Use   Smoking status: Former    Packs/day: 1.00    Years: 50.00    Additional pack years: 0.00    Total pack years: 50.00    Types: Cigarettes   Smokeless tobacco: Never  Vaping Use   Vaping Use: Never used  Substance and Sexual Activity   Alcohol use: Not Currently   Drug use: Not on file   Sexual activity: Not on file  Other Topics Concern   Not on file  Social History Narrative   Not on file   Social Determinants of Health   Financial Resource Strain: Low Risk  (01/06/2023)   Overall Financial Resource Strain (CARDIA)    Difficulty of Paying Living Expenses: Not hard at all  Food Insecurity: No Food Insecurity (01/06/2023)   Hunger Vital Sign    Worried  About Running Out of Food in the Last Year: Never true    Ran Out of Food in the Last Year: Never true  Transportation Needs: No Transportation Needs (01/06/2023)   PRAPARE - Hydrologist (Medical): No    Lack of Transportation (Non-Medical): No  Physical Activity: Sufficiently Active (01/06/2023)   Exercise Vital Sign    Days of Exercise per Week: 7 days    Minutes of Exercise per Session: 30 min  Stress: No Stress Concern Present (01/06/2023)   [place]    Feeling of Stress : Not at all  Social Connections: Moderately Isolated (01/06/2023)   Social Connection and Isolation Panel [NHANES]     Frequency of Communication with Friends and Family: More than three times a week    Frequency of Social Gatherings with Friends and Family: More than three times a week    Attends Religious Services: 1 to 4 times per year    Active Member of [employer] or Organizations: No    Attends Music therapist: Never    Marital Status: Divorced    Tobacco Counseling Counseling given: Not Answered   Clinical Intake:  Pre-visit preparation completed: Yes        Nutritional Risks: None Diabetes: No  How often do you need to have someone help you when you read instructions, pamphlets, or other written materials from your doctor or pharmacy?: 1 - Never  Diabetic?no  Interpreter Needed?: No  Information entered by :: Kirke Shaggy, LPN   Activities of Daily Living    01/06/2023    1:36 PM  In your present state of health, do you have any difficulty performing the following activities:  Hearing? 0  Vision? 0  Difficulty concentrating or making decisions? 0  Walking or climbing stairs? 0  Dressing or bathing? 0  Doing errands, shopping? 0  Preparing Food and eating ? N  Using the Toilet? N  In the past six months, have you accidently leaked urine? N  Do you have problems with loss of bowel control? N  Managing your Medications? N  Managing your Finances? N  Housekeeping or managing your Housekeeping? N    Patient Care Team: Olin Hauser, DO as PCP - General (Family Medicine) Vanita Ingles, RN as Registered Nurse ([place]) Valente David, RN as [place] any recent [place] you may have received from other than [facility] providers in the past year (date may be approximate).     Assessment:   This is a routine wellness examination for Stacy Schmidt.  Hearing/Vision screen Hearing Screening - Comments:: No aids Vision Screening - Comments:: Wears glasses  Dietary issues and exercise activities  discussed: Current Exercise Habits: Home exercise routine, Time (Minutes): 30, Frequency (Times/Week): 7, Weekly Exercise (Minutes/Week): 210, Intensity: Mild   Goals Addressed             This Visit's Progress    DIET - EAT MORE FRUITS AND VEGETABLES         Depression Screen    01/06/2023    1:30 PM 01/04/2022    2:29 PM 12/30/2021    3:03 PM 11/18/2021   10:39 AM  PHQ 2/9 Scores  PHQ - 2 Score 0 0 0 0  PHQ- 9 Score 0 0 3     Fall Risk    01/06/2023    1:35 PM 01/04/2022    2:32 PM 01/04/2022  1:37 PM 12/30/2021    3:03 PM  Fall Risk   Falls in the past year? 0 0 0 0  Number falls in past yr: 0 0  0  Injury with Fall? 0 0  0  Risk for fall due to : No Fall Risks No Fall Risks  No Fall Risks  Follow up Falls prevention discussed;Falls evaluation completed Falls evaluation completed  Falls evaluation completed    FALL RISK PREVENTION PERTAINING TO THE HOME:  Any stairs in or around the home? Yes  If so, are there any without handrails? No  Home free of loose throw rugs in walkways, pet beds, electrical cords, etc? Yes  Adequate lighting in your home to reduce risk of falls? Yes   ASSISTIVE DEVICES UTILIZED TO PREVENT FALLS:  Life alert? No  Use of a cane, walker or w/c? No  Grab bars in the bathroom? No  Shower chair or bench in shower? No  Elevated toilet seat or a handicapped toilet? No    Cognitive Function:        01/06/2023    1:42 PM 01/04/2022    2:27 PM  6CIT Screen  What Year? 0 points 0 points  What month? 0 points 0 points  What time? 0 points 0 points  Count back from 20 0 points 0 points  Months in reverse 0 points 0 points  Repeat phrase 0 points 2 points  Total Score 0 points 2 points    Immunizations Immunization History  Administered Date(s) Administered   Fluad Quad(high Dose 65+) 07/25/2019, 07/07/2022   Influenza, High Dose Seasonal PF 08/05/2017, 07/22/2020, 07/27/2021   Influenza-Unspecified 08/08/2018, 08/19/2019   Moderna  Sars-Covid-2 Vaccination 12/27/2019, 01/24/2020   Pneumococcal Conjugate-13 06/08/2016   Pneumococcal Polysaccharide-23 08/08/2018   Tdap 08/05/2017   Zoster Recombinat (Shingrix) 12/16/2020, 11/23/2021    TDAP status: Up to date  Flu Vaccine status: Up to date  Pneumococcal vaccine status: Up to date  Covid-19 vaccine status: Completed vaccines  Qualifies for Shingles Vaccine? Yes   Zostavax completed No   Shingrix Completed?: Yes  Screening Tests Health Maintenance  Topic Date Due   Hepatitis C Screening  Never done   Lung Cancer Screening  Never done   MAMMOGRAM  Never done   DEXA SCAN  Never done   COVID-19 Vaccine (3 - Moderna risk series) 02/21/2020   COLONOSCOPY (Pts 45-38yrs Insurance coverage will need to be confirmed)  07/08/2023 (Originally 06/18/1995)   Medicare Annual Wellness (AWV)  01/06/2024   DTaP/Tdap/Td (2 - Td or Tdap) 08/06/2027   Pneumonia Vaccine 74+ Years old  Completed   INFLUENZA VACCINE  Completed   Zoster Vaccines- Shingrix  Completed   HPV VACCINES  Aged Out    Health Maintenance  Health Maintenance Due  Topic Date Due   Hepatitis C Screening  Never done   Lung Cancer Screening  Never done   MAMMOGRAM  Never done   DEXA SCAN  Never done   COVID-19 Vaccine (3 - Moderna risk series) 02/21/2020    Colorectal cancer screening: Type of screening: Colonoscopy. Completed postponed until 07/08/23. Repeat every 10 years  Declined referral for mammogram and BDS   Lung Cancer Screening: (Low Dose CT Chest recommended if Age 73-80 years, 30 pack-year currently smoking OR have quit w/in 15years.) does qualify.   Lung Cancer Screening Referral: ordered today  Additional Screening:  Hepatitis C Screening: does qualify; Completed no  Vision Screening: Recommended annual ophthalmology exams for early detection of glaucoma  and other disorders of the eye. Is the patient up to date with their annual eye exam?  Yes  Who is the provider or what is  the name of the office in which the patient attends annual eye exams? [place] If pt is not established with a provider, would they like to be referred to a provider to establish care? No .   Dental Screening: Recommended annual dental exams for proper oral hygiene  Community Resource Referral / Chronic Care Management: CRR required this visit?  No   CCM required this visit?  No      Plan:     I have personally reviewed and noted the following in the patient's chart:   Medical and social history Use of alcohol, tobacco or illicit drugs  Current medications and supplements including opioid prescriptions. Patient is not currently taking opioid prescriptions. Functional ability and status Nutritional status Physical activity Advanced directives List of other physicians Hospitalizations, surgeries, and ER visits in previous 12 months Vitals Screenings to include cognitive, depression, and falls Referrals and appointments  In addition, I have reviewed and discussed with patient certain preventive protocols, quality metrics, and best practice recommendations. A written personalized care plan for preventive services as well as general preventive health recommendations were provided to patient.     Dionisio David, LPN   [id number]   Nurse Notes: none- lung ca screening ordered

## 2023-01-06 NOTE — Patient Instructions (Signed)
Stacy Schmidt , Thank you for taking time to come for your Medicare Wellness Visit. I appreciate your ongoing commitment to your health goals. Please review the following plan we discussed and let me know if I can assist you in the future.   These are the goals we discussed:  Goals      CCM Expected Outcome:  Monitor, Self-Manage and Reduce Symptoms of  Pre-Diabetes     CCM Expected outcome:  Monitor, Self-Manage and Reduce Symptoms of HLD     DIET - EAT MORE FRUITS AND VEGETABLES     Have 3 meals a day     Weight management         This is a list of the screening recommended for you and due dates:  Health Maintenance  Topic Date Due   Hepatitis C Screening: USPSTF Recommendation to screen - Ages 45-79 yo.  Never done   Screening for Lung Cancer  Never done   Mammogram  Never done   DEXA scan (bone density measurement)  Never done   COVID-19 Vaccine (3 - Moderna risk series) 02/21/2020   Colon Cancer Screening  07/08/2023*   Medicare Annual Wellness Visit  01/06/2024   DTaP/Tdap/Td vaccine (2 - Td or Tdap) 08/06/2027   Pneumonia Vaccine  Completed   Flu Shot  Completed   Zoster (Shingles) Vaccine  Completed   HPV Vaccine  Aged Out  *Topic was postponed. The date shown is not the original due date.    Advanced directives: no  Conditions/risks identified: none  Next appointment: Follow up in one year for your annual wellness visit 01/12/23 @ 1:30 pm by phone   Preventive Care 65 Years and Older, Female Preventive care refers to lifestyle choices and visits with your health care provider that can promote health and wellness. What does preventive care include? A yearly physical exam. This is also called an annual well check. Dental exams once or twice a year. Routine eye exams. Ask your health care provider how often you should have your eyes checked. Personal lifestyle choices, including: Daily care of your teeth and gums. Regular physical activity. Eating a healthy  diet. Avoiding tobacco and drug use. Limiting alcohol use. Practicing safe sex. Taking low-dose aspirin every day. Taking vitamin and mineral supplements as recommended by your health care provider. What happens during an annual well check? The services and screenings done by your health care provider during your annual well check will depend on your age, overall health, lifestyle risk factors, and family history of disease. Counseling  Your health care provider may ask you questions about your: Alcohol use. Tobacco use. Drug use. Emotional well-being. Home and relationship well-being. Sexual activity. Eating habits. History of falls. Memory and ability to understand (cognition). Work and work Statistician. Reproductive health. Screening  You may have the following tests or measurements: Height, weight, and BMI. Blood pressure. Lipid and cholesterol levels. These may be checked every 5 years, or more frequently if you are over 32 years old. Skin check. Lung cancer screening. You may have this screening every year starting at age 52 if you have a 30-pack-year history of smoking and currently smoke or have quit within the past 15 years. Fecal occult blood test (FOBT) of the stool. You may have this test every year starting at age 93. Flexible sigmoidoscopy or colonoscopy. You may have a sigmoidoscopy every 5 years or a colonoscopy every 10 years starting at age 70. Hepatitis C blood test. Hepatitis B blood test. Sexually  transmitted disease (STD) testing. Diabetes screening. This is done by checking your blood sugar (glucose) after you have not eaten for a while (fasting). You may have this done every 1-3 years. Bone density scan. This is done to screen for osteoporosis. You may have this done starting at age 44. Mammogram. This may be done every 1-2 years. Talk to your health care provider about how often you should have regular mammograms. Talk with your health care provider about  your test results, treatment options, and if necessary, the need for more tests. Vaccines  Your health care provider may recommend certain vaccines, such as: Influenza vaccine. This is recommended every year. Tetanus, diphtheria, and acellular pertussis (Tdap, Td) vaccine. You may need a Td booster every 10 years. Zoster vaccine. You may need this after age 66. Pneumococcal 13-valent conjugate (PCV13) vaccine. One dose is recommended after age 58. Pneumococcal polysaccharide (PPSV23) vaccine. One dose is recommended after age 62. Talk to your health care provider about which screenings and vaccines you need and how often you need them. This information is not intended to replace advice given to you by your health care provider. Make sure you discuss any questions you have with your health care provider. Document Released: 10/31/2015 Document Revised: 06/23/2016 Document Reviewed: 08/05/2015 Elsevier Interactive Patient Education  2017 [place] Prevention in the Home Falls can cause injuries. They can happen to people of all ages. There are many things you can do to make your home safe and to help prevent falls. What can I do on the outside of my home? Regularly fix the edges of walkways and driveways and fix any cracks. Remove anything that might make you trip as you walk through a door, such as a raised step or threshold. Trim any bushes or trees on the path to your home. Use bright outdoor lighting. Clear any walking paths of anything that might make someone trip, such as rocks or tools. Regularly check to see if handrails are loose or broken. Make sure that both sides of any steps have handrails. Any raised decks and porches should have guardrails on the edges. Have any leaves, snow, or ice cleared regularly. Use sand or salt on walking paths during winter. Clean up any spills in your garage right away. This includes oil or grease spills. What can I do in the bathroom? Use  night lights. Install grab bars by the toilet and in the tub and shower. Do not use towel bars as grab bars. Use non-skid mats or decals in the tub or shower. If you need to sit down in the shower, use a plastic, non-slip stool. Keep the floor dry. Clean up any water that spills on the floor as soon as it happens. Remove soap buildup in the tub or shower regularly. Attach bath mats securely with double-sided non-slip rug tape. Do not have throw rugs and other things on the floor that can make you trip. What can I do in the bedroom? Use night lights. Make sure that you have a light by your bed that is easy to reach. Do not use any sheets or blankets that are too big for your bed. They should not hang down onto the floor. Have a firm chair that has side arms. You can use this for support while you get dressed. Do not have throw rugs and other things on the floor that can make you trip. What can I do in the kitchen? Clean up any spills right away. Avoid  walking on wet floors. Keep items that you use a lot in easy-to-reach places. If you need to reach something above you, use a strong step stool that has a grab bar. Keep electrical cords out of the way. Do not use floor polish or wax that makes floors slippery. If you must use wax, use non-skid floor wax. Do not have throw rugs and other things on the floor that can make you trip. What can I do with my stairs? Do not leave any items on the stairs. Make sure that there are handrails on both sides of the stairs and use them. Fix handrails that are broken or loose. Make sure that handrails are as long as the stairways. Check any carpeting to make sure that it is firmly attached to the stairs. Fix any carpet that is loose or worn. Avoid having throw rugs at the top or bottom of the stairs. If you do have throw rugs, attach them to the floor with carpet tape. Make sure that you have a light switch at the top of the stairs and the bottom of the  stairs. If you do not have them, ask someone to add them for you. What else can I do to help prevent falls? Wear shoes that: Do not have high heels. Have rubber bottoms. Are comfortable and fit you well. Are closed at the toe. Do not wear sandals. If you use a stepladder: Make sure that it is fully opened. Do not climb a closed stepladder. Make sure that both sides of the stepladder are locked into place. Ask someone to hold it for you, if possible. Clearly mark and make sure that you can see: Any grab bars or handrails. First and last steps. Where the edge of each step is. Use tools that help you move around (mobility aids) if they are needed. These include: Canes. Walkers. Scooters. Crutches. Turn on the lights when you go into a dark area. Replace any light bulbs as soon as they burn out. Set up your furniture so you have a clear path. Avoid moving your furniture around. If any of your floors are uneven, fix them. If there are any pets around you, be aware of where they are. Review your medicines with your doctor. Some medicines can make you feel dizzy. This can increase your chance of falling. Ask your doctor what other things that you can do to help prevent falls. This information is not intended to replace advice given to you by your health care provider. Make sure you discuss any questions you have with your health care provider. Document Released: 07/31/2009 Document Revised: 03/11/2016 Document Reviewed: 11/08/2014 Elsevier Interactive Patient Education  2017 [employer].

## 2023-01-10 ENCOUNTER — Other Ambulatory Visit: Payer: Self-pay | Admitting: Family Medicine

## 2023-01-10 ENCOUNTER — Ambulatory Visit (INDEPENDENT_AMBULATORY_CARE_PROVIDER_SITE_OTHER): Payer: 59 | Admitting: Family Medicine

## 2023-01-10 ENCOUNTER — Encounter: Payer: Self-pay | Admitting: Family Medicine

## 2023-01-10 VITALS — BP 132/78 | HR 75 | Ht 65.0 in | Wt 159.0 lb

## 2023-01-10 DIAGNOSIS — Z0000 Encounter for general adult medical examination without abnormal findings: Secondary | ICD-10-CM

## 2023-01-10 DIAGNOSIS — Z1211 Encounter for screening for malignant neoplasm of colon: Secondary | ICD-10-CM | POA: Diagnosis not present

## 2023-01-10 DIAGNOSIS — E7800 Pure hypercholesterolemia, unspecified: Secondary | ICD-10-CM | POA: Diagnosis not present

## 2023-01-10 DIAGNOSIS — R7303 Prediabetes: Secondary | ICD-10-CM

## 2023-01-10 DIAGNOSIS — E039 Hypothyroidism, unspecified: Secondary | ICD-10-CM

## 2023-01-10 DIAGNOSIS — R7989 Other specified abnormal findings of blood chemistry: Secondary | ICD-10-CM

## 2023-01-10 DIAGNOSIS — Z780 Asymptomatic menopausal state: Secondary | ICD-10-CM | POA: Diagnosis not present

## 2023-01-10 DIAGNOSIS — M170 Bilateral primary osteoarthritis of knee: Secondary | ICD-10-CM

## 2023-01-10 NOTE — Progress Notes (Signed)
Subjective:    Patient ID: Stacy Schmidt, female    DOB: [birth year], 72 y.o.   MRN: [id number]  Stacy Schmidt is a 72 y.o. female presenting on 01/10/2023 for Annual Exam   HPI  Here for Annual Physical and Lab Review  Hx Pulm Nodule Screening [facility], [facility] done in 2022, she is due for repeat   Dentures She is limiting her diet to soft diet to avoid significant chewing.  Elevated LFTs Improved on last lab   Hyperlipidemia Last lab LDL cholesterol 116, previously 148   Hypothyroidism Last labs normal range. Taking Levothyroxine 100 mcg daily   Pre-Diabetes A1c 5.9 last lab, previously 5.8 to 6.1   [place] Diet plan has done well. Improves circulation and blood flow Helps lower cholesterol   Osteoarthritis Knees She is doing stationary bike exercises. She has been evaluated and they told her it was no problem. She has tried topical medication. She has limitation with prolonged walking can cause worsening soreness in knees. Improved on Voltaren   GERD Chronic problem, on PPI Omeprazole 40mg  daily.   BPPV Cerumen Reports occasional brief dizziness spells with quick turning.   Varicose veins   Forgetfulness memory questions today    Health Maintenance: UTD Flu Shot.  Due D[facility]A Bone D[facility] ago [facility]   Shingrix updated, Pneumonia vaccine updated.   Declines Mammogram screening, last done 5-10 years. Declines mammogram.   Colonoscopy was done in [place] in past 3 years, she agrees to cologuard        01/06/2023    1:30 PM 01/04/2022    2:29 PM 12/30/2021    3:03 PM  Depression screen PHQ 2/9  Decreased Interest 0 0 0  Down, Depressed, Hopeless 0 0 0  PHQ - 2 Score 0 0 0  Altered sleeping 0 0 0  Tired, decreased energy 0 0 3  Change in appetite 0 0 0  Feeling bad or failure about yourself  0 0 0  Trouble concentrating 0 0 0  Moving slowly or fidgety/restless 0 0 0  Suicidal thoughts 0 0 0  PHQ-9 Score 0 0 3   Difficult doing work/chores Not difficult at all Not difficult at all Not difficult at all      12/30/2021    3:04 PM  GAD 7 : Generalized Anxiety Score  Nervous, Anxious, on Edge 0  Control/stop worrying 0  Worry too much - different things 0  Trouble relaxing 0  Restless 0  Easily annoyed or irritable 0  Afraid - awful might happen 0  Total GAD 7 Score 0  Anxiety Difficulty Not difficult at all      Past Medical History:  Diagnosis Date   Diverticulitis    GERD (gastroesophageal reflux disease)    Hypothyroidism    History reviewed. No pertinent surgical history. Social History   Socioeconomic History   Marital status: Single    Spouse name: Not on file   Number of children: 1   Years of education: Not on file   Highest education level: Not on file  Occupational History   Occupation: retired  Tobacco Use   Smoking status: Former    Packs/day: 1.00    Years: 50.00    Additional pack years: 0.00    Total pack years: 50.00    Types: Cigarettes   Smokeless tobacco: Never  Vaping Use   Vaping Use: Never used  Substance and Sexual Activity   Alcohol use: Not  Currently   Drug use: Not on file   Sexual activity: Not on file  Other Topics Concern   Not on file  Social History Narrative   Not on file   Social Determinants of Health   Financial Resource Strain: Low Risk  (01/06/2023)   Overall Financial Resource Strain (CARDIA)    Difficulty of Paying Living Expenses: Not hard at all  Food Insecurity: No Food Insecurity (01/06/2023)   Hunger Vital Sign    Worried About Running Out of Food in the Last Year: Never true    Ran Out of Food in the Last Year: Never true  Transportation Needs: No Transportation Needs (01/06/2023)   PRAPARE - Hydrologist (Medical): No    Lack of Transportation (Non-Medical): No  Physical Activity: Sufficiently Active (01/06/2023)   Exercise Vital Sign    Days of Exercise per Week: 7 days    Minutes of  Exercise per Session: 30 min  Stress: No Stress Concern Present (01/06/2023)   [place]    Feeling of Stress : Not at all  Social Connections: Moderately Isolated (01/06/2023)   Social Connection and Isolation Panel [NHANES]    Frequency of Communication with Friends and Family: More than three times a week    Frequency of Social Gatherings with Friends and Family: More than three times a week    Attends Religious Services: 1 to 4 times per year    Active Member of [employer] or Organizations: No    Attends Archivist Meetings: Never    Marital Status: Divorced  Human resources officer Violence: Not At Risk (01/06/2023)   Humiliation, Afraid, Rape, and Kick questionnaire    Fear of Current or Ex-Partner: No    Emotionally Abused: No    Physically Abused: No    Sexually Abused: No   Family History  Problem Relation Age of Onset   Hypertension Mother    Congestive Heart Failure Mother    Hypertension Father    Stroke Father    Cancer Sister    Kidney disease Sister    Kidney cancer Sister 38   Breast cancer Sister    Cancer Brother    Kidney disease Brother    Aneurysm Brother    Lung cancer Brother    Current Outpatient Medications on File Prior to Visit  Medication Sig   Cholecalciferol (VITAMIN D-3) 125 MCG (5000 UT) TABS Take by mouth.   fish oil-omega-3 fatty acids 1000 MG capsule Take 2 g by mouth daily.   levothyroxine (SYNTHROID) 100 MCG tablet Take 1 tablet (100 mcg total) by mouth daily before breakfast.   omeprazole (PRILOSEC) 40 MG capsule Take 1 capsule (40 mg total) by mouth daily before breakfast.   Diclofenac Sodium (VOLTAREN EX) Apply topically. (Patient not taking: Reported on 01/06/2023)   No current facility-administered medications on file prior to visit.    Review of Systems  Constitutional:  Negative for activity change, appetite change, chills, diaphoresis, fatigue and fever.  HENT:   Negative for congestion and hearing loss.   Eyes:  Negative for visual disturbance.  Respiratory:  Negative for cough, chest tightness, shortness of breath and wheezing.   Cardiovascular:  Negative for chest pain, palpitations and leg swelling.  Gastrointestinal:  Negative for abdominal pain, constipation, diarrhea, nausea and vomiting.  Genitourinary:  Negative for dysuria, frequency and hematuria.  Musculoskeletal:  Negative for arthralgias and neck pain.  Skin:  Negative for rash.  Neurological:  Negative for dizziness, weakness, light-headedness, numbness and headaches.  Hematological:  Negative for adenopathy.  Psychiatric/Behavioral:  Negative for behavioral problems, dysphoric mood and sleep disturbance.    Per HPI unless specifically indicated above      Objective:    BP 132/78   Pulse 75   Ht 5\' 5"  (1.651 m)   Wt 159 lb (72.1 kg)   SpO2 96%   BMI 26.46 kg/m   Wt Readings from Last 3 Encounters:  01/10/23 159 lb (72.1 kg)  01/06/23 161 lb (73 kg)  07/07/22 161 lb (73 kg)    Physical Exam Vitals and nursing note reviewed.  Constitutional:      General: She is not in acute distress.    Appearance: She is well-developed. She is not diaphoretic.     Comments: Well-appearing, comfortable, cooperative  HENT:     Head: Normocephalic and atraumatic.  Eyes:     General:        Right eye: No discharge.        Left eye: No discharge.     Conjunctiva/sclera: Conjunctivae normal.     Pupils: Pupils are equal, round, and reactive to light.  Neck:     Thyroid: No thyromegaly.     Vascular: No carotid bruit.  Cardiovascular:     Rate and Rhythm: Normal rate and regular rhythm.     Pulses: Normal pulses.     Heart sounds: Normal heart sounds. No murmur heard. Pulmonary:     Effort: Pulmonary effort is normal. No respiratory distress.     Breath sounds: Normal breath sounds. No wheezing or rales.  Abdominal:     General: Bowel sounds are normal. There is no distension.      Palpations: Abdomen is soft. There is no mass.     Tenderness: There is no abdominal tenderness.  Musculoskeletal:        General: No tenderness. Normal range of motion.     Cervical back: Normal range of motion and neck supple.     Right lower leg: No edema.     Left lower leg: No edema.     Comments: Upper / Lower Extremities: - Normal muscle tone, strength bilateral upper extremities 5/5, lower extremities 5/5  Lymphadenopathy:     Cervical: No cervical adenopathy.  Skin:    General: Skin is warm and dry.     Findings: No erythema or rash.  Neurological:     Mental Status: She is alert and oriented to person, place, and time.     Comments: Distal sensation intact to light touch all extremities  Psychiatric:        Mood and Affect: Mood normal.        Behavior: Behavior normal.        Thought Content: Thought content normal.     Comments: Well groomed, good eye contact, normal speech and thoughts    Results for orders placed or performed in visit on 12/31/22  T4, free  Result Value Ref Range   Free T4 1.2 0.8 - 1.8 ng/dL  TSH  Result Value Ref Range   TSH 0.69 0.40 - 4.50 mIU/L  Lipid panel  Result Value Ref Range   Cholesterol 196 <200 mg/dL   HDL 56 > OR = 50 mg/dL   Triglycerides 125 <150 mg/dL   LDL Cholesterol (Calc) 116 (H) mg/dL (calc)   Total CHOL/HDL Ratio 3.5 <5.0 (calc)   Non-HDL Cholesterol (Calc) 140 (H) <130 mg/dL (calc)  COMPLETE METABOLIC  PANEL WITH GFR  Result Value Ref Range   Glucose, Bld 106 (H) 65 - 99 mg/dL   BUN 15 7 - 25 mg/dL   Creat 0.60 0.60 - 1.00 mg/dL   eGFR 95 > OR = 60 mL/min/1.53m2   BUN/Creatinine Ratio SEE NOTE: 6 - 22 (calc)   Sodium 141 135 - 146 mmol/L   Potassium 4.4 3.5 - 5.3 mmol/L   Chloride 104 98 - 110 mmol/L   CO2 30 20 - 32 mmol/L   Calcium 9.7 8.6 - 10.4 mg/dL   Total Protein 6.7 6.1 - 8.1 g/dL   Albumin 4.6 3.6 - 5.1 g/dL   Globulin 2.1 1.9 - 3.7 g/dL (calc)   AG Ratio 2.2 1.0 - 2.5 (calc)   Total Bilirubin 0.6  0.2 - 1.2 mg/dL   Alkaline phosphatase (APISO) 84 37 - 153 U/L   AST 74 (H) 10 - 35 U/L   ALT 128 (H) 6 - 29 U/L  CBC with Differential/Platelet  Result Value Ref Range   WBC 6.1 3.8 - 10.8 Thousand/uL   RBC 4.28 3.80 - 5.10 Million/uL   Hemoglobin 13.1 11.7 - 15.5 g/dL   HCT 39.3 35.0 - 45.0 %   MCV 91.8 80.0 - 100.0 fL   MCH 30.6 27.0 - 33.0 pg   MCHC 33.3 32.0 - 36.0 g/dL   RDW 11.8 11.0 - 15.0 %   Platelets 291 140 - 400 Thousand/uL   MPV 9.7 7.5 - 12.5 fL   Neutro Abs 2,879 1,500 - 7,800 cells/uL   Lymphs Abs 2,410 850 - 3,900 cells/uL   Absolute Monocytes 659 200 - 950 cells/uL   Eosinophils Absolute 110 15 - 500 cells/uL   Basophils Absolute 43 0 - 200 cells/uL   Neutrophils Relative % 47.2 %   Total Lymphocyte 39.5 %   Monocytes Relative 10.8 %   Eosinophils Relative 1.8 %   Basophils Relative 0.7 %  Hemoglobin A1c  Result Value Ref Range   Hgb A1c MFr Bld 5.9 (H) <5.7 % of total Hgb   Mean Plasma Glucose 123 mg/dL   eAG (mmol/L) 6.8 mmol/L      Assessment & Plan:   Problem List Items Addressed This Visit     Acquired hypothyroidism   Pre-diabetes   Primary osteoarthritis of both knees   Pure hypercholesterolemia   Other Visit Diagnoses     Annual physical exam    -  Primary   Postmenopausal estrogen deficiency       Relevant Orders   DG Bone Density   Screening for colon cancer       Relevant Orders   Cologuard       Updated Health Maintenance information Reviewed recent lab results with patient Encouraged improvement to lifestyle with diet and exercise Goal of weight loss  Ordered DEXA screening  Also taking Vinia grape supplement  Stable sugar A1c in pre diabetic range.  Cholesterol is stable at 116, goal is to maintain this < 100 is goal to help reduce cardiovascular risk.  Ordered the Cologuard (home kit) test for colon cancer screening. Stay tuned for further updates.  Check food labels for low cholesterol food options  Let me know  if you need a nutritionist referral  May try meal replacement shake or supplement  -------------------------------------------   Please contact us back if the Lung Cancer Screening has not been scheduled within 2-3 weeks.  Orders Placed This Encounter  Procedures   DG Bone Density    Standing  Status:   Future    Standing Expiration Date:   01/10/2024    Order Specific Question:   Reason for Exam (SYMPTOM  OR DIAGNOSIS REQUIRED)    Answer:   post menopausal estrogen deficiency osteoporosis screening    Order Specific Question:   Preferred imaging location?    Answer:   [place]     No orders of the defined types were placed in this encounter.     Follow up plan: Return in about 1 year (around 01/10/2024) for 1 year fasting lab only then 1 week later Annual Physical.  1 year Fasting labs then return 1 week  Nobie Putnam, [facility] 01/10/2023, 2:40 PM

## 2023-01-10 NOTE — Patient Instructions (Addendum)
Thank you for coming to the office today.  Check food labels for low cholesterol food options  Let me know if you need a nutritionist referral  May try meal replacement shake or supplement  -------------------------------------------   Please contact us back if the Lung Cancer Screening has not been scheduled within 2-3 weeks.   Recent Labs    06/29/22 0759 01/03/23 0759  HGBA1C 5.8* 5.9*   Stable sugar A1c in pre diabetic range.  Cholesterol is stable at 116, goal is to maintain this < 100 is goal to help reduce cardiovascular risk.  Ordered the Cologuard (home kit) test for colon cancer screening. Stay tuned for further updates.  It will be shipped to you directly. If not received in 2-4 weeks, call us or the company.   If you send it back and no results are received in 2-4 weeks, call us or the company as well!   Colon Cancer Screening: - For all adults age 34+ routine colon cancer screening is highly recommended.     - Recent guidelines from [place] recommend starting age of 20 - Early detection of colon cancer is important, because often there are no warning signs or symptoms, also if found early usually it can be cured. Late stage is hard to treat.   - If Cologuard is NEGATIVE, then it is good for 3 years before next due - If Cologuard is POSITIVE, then it is strongly advised to get a Colonoscopy, which allows the GI doctor to locate the source of the cancer or polyp (even very early stage) and treat it by removing it. ------------------------- Follow instructions to collect sample, you may call the company for any help or questions, 24/7 telephone support at [phone].   Please schedule a Follow-up Appointment to: Return in about 1 year (around 01/10/2024) for 1 year fasting lab only then 1 week later Annual Physical.  If you have any other questions or concerns, please feel free to call the office or send a message through [facility]. You may also  schedule an earlier appointment if necessary.  Additionally, you may be receiving a survey about your experience at our office within a few days to 1 week by e-mail or mail. We value your feedback.  Nobie Putnam, DO [place]

## 2023-01-23 ENCOUNTER — Other Ambulatory Visit: Payer: Self-pay | Admitting: Family Medicine

## 2023-01-23 DIAGNOSIS — K219 Gastro-esophageal reflux disease without esophagitis: Secondary | ICD-10-CM

## 2023-01-23 DIAGNOSIS — E039 Hypothyroidism, unspecified: Secondary | ICD-10-CM

## 2023-01-24 ENCOUNTER — Telehealth: Payer: Self-pay | Admitting: Family Medicine

## 2023-01-24 NOTE — Telephone Encounter (Signed)
Medication Refill - Medication: evothyroxine (SYNTHROID) 100 MCG table  and omeprazole (PRILOSEC) 40 MG capsul   Has the patient contacted their pharmacy? Yes.   Pahrmacy told patient on yesterday they had not recvd script but would send a message to provider  Preferred Pharmacy (with phone number or street name):  [facility] [address] [place] - [address] Phone: [phone]  Fax: [phone]     Has the patient been seen for an appointment in the last year OR does the patient have an upcoming appointment? Yes.    Patient stated she has only one pill left of the Omeprazole before she will be out

## 2023-01-25 DIAGNOSIS — Z1211 Encounter for screening for malignant neoplasm of colon: Secondary | ICD-10-CM | POA: Diagnosis not present

## 2023-01-25 NOTE — Telephone Encounter (Signed)
Requested Prescriptions  Pending Prescriptions Disp Refills   levothyroxine (SYNTHROID) 100 MCG tablet [Pharmacy Med Name: Levothyroxine Sodium 100 MCG Oral Tablet] 90 tablet 0    Sig: TAKE 1 TABLET BY MOUTH ONCE DAILY BEFORE BREAKFAST     Endocrinology:  Hypothyroid Agents Passed - 01/23/2023 12:43 PM      Passed - TSH in normal range and within 360 days    TSH  Date Value Ref Range Status  01/03/2023 0.69 0.40 - 4.50 mIU/L Final         Passed - Valid encounter within last 12 months    Recent Outpatient Visits           2 weeks ago Annual physical exam   [place] [facility] [name], DO   6 months ago Pure hypercholesterolemia   [place] [facility] [name], DO   1 year ago Annual physical exam   [place] [facility] [name], DO   1 year ago Acquired hypothyroidism   [place] [facility] Althea Charon, Netta Neat, DO       Future Appointments             In 11 months Althea Charon, Netta Neat, DO [place] [facility]             omeprazole (PRILOSEC) 40 MG capsule [Pharmacy Med Name: Omeprazole 40 MG Oral Capsule Delayed Release] 90 capsule 0    Sig: TAKE 1 CAPSULE BY MOUTH ONCE DAILY BEFORE BREAKFAST     Gastroenterology: Proton Pump Inhibitors Passed - 01/23/2023 12:43 PM      Passed - Valid encounter within last 12 months    Recent Outpatient Visits           2 weeks ago Annual physical exam   [place] [facility] [place], Netta Neat, DO   6 months ago Pure hypercholesterolemia   [place] [facility] [name], DO   1 year ago Annual physical exam   [place] [facility] [name], DO   1 year ago Acquired hypothyroidism   [place] [facility] [name], DO       Future Appointments              In 11 months Althea Charon, Netta Neat, DO [place] [facility], [facility]

## 2023-01-25 NOTE — Telephone Encounter (Signed)
Requested medication (s) are due for refill today: Yes  Requested medication (s) are on the active medication list: Yes  Last refill:  12/31/22  Future visit scheduled: Yes  Notes to clinic:  Prescription has expired.    Requested Prescriptions  Pending Prescriptions Disp Refills   omeprazole (PRILOSEC) 40 MG capsule [Pharmacy Med Name: Omeprazole 40 MG Oral Capsule Delayed Release] 90 capsule 0    Sig: TAKE 1 CAPSULE BY MOUTH ONCE DAILY BEFORE BREAKFAST     Gastroenterology: Proton Pump Inhibitors Passed - 01/23/2023 12:43 PM      Passed - Valid encounter within last 12 months    Recent Outpatient Visits           2 weeks ago Annual physical exam   [place] [facility] [name], DO   6 months ago Pure hypercholesterolemia   [place] [facility] [name], DO   1 year ago Annual physical exam   [place] [facility] [name], DO   1 year ago Acquired hypothyroidism   [place] [facility] Althea Charon, Netta Neat, DO       Future Appointments             In 11 months Althea Charon, Netta Neat, DO [place] [facility]            Signed Prescriptions Disp Refills   levothyroxine (SYNTHROID) 100 MCG tablet 90 tablet 0    Sig: TAKE 1 TABLET BY MOUTH ONCE DAILY BEFORE BREAKFAST     Endocrinology:  Hypothyroid Agents Passed - 01/23/2023 12:43 PM      Passed - TSH in normal range and within 360 days    TSH  Date Value Ref Range Status  01/03/2023 0.69 0.40 - 4.50 mIU/L Final         Passed - Valid encounter within last 12 months    Recent Outpatient Visits           2 weeks ago Annual physical exam   [place] [facility] [place], Netta Neat, DO   6 months ago Pure hypercholesterolemia   [place] [facility] [name], DO   1 year ago Annual physical exam   [facility] [name], DO   1 year ago Acquired hypothyroidism   [place] [facility] [name], DO       Future Appointments             In 11 months Althea Charon, Netta Neat, DO  [facility], [facility]

## 2023-02-01 LAB — COLOGUARD: COLOGUARD: NEGATIVE

## 2023-02-21 DIAGNOSIS — H43813 Vitreous degeneration, bilateral: Secondary | ICD-10-CM | POA: Diagnosis not present

## 2023-02-21 DIAGNOSIS — H2513 Age-related nuclear cataract, bilateral: Secondary | ICD-10-CM | POA: Diagnosis not present

## 2023-02-21 DIAGNOSIS — H40003 Preglaucoma, unspecified, bilateral: Secondary | ICD-10-CM | POA: Diagnosis not present

## 2023-04-15 ENCOUNTER — Other Ambulatory Visit: Payer: 59

## 2023-05-12 ENCOUNTER — Encounter: Payer: Self-pay | Admitting: Emergency Medicine

## 2023-05-23 ENCOUNTER — Other Ambulatory Visit: Payer: Self-pay | Admitting: *Deleted

## 2023-05-23 DIAGNOSIS — Z122 Encounter for screening for malignant neoplasm of respiratory organs: Secondary | ICD-10-CM

## 2023-05-23 DIAGNOSIS — Z87891 Personal history of nicotine dependence: Secondary | ICD-10-CM

## 2023-06-17 ENCOUNTER — Other Ambulatory Visit: Payer: Self-pay | Admitting: Family Medicine

## 2023-06-17 DIAGNOSIS — E039 Hypothyroidism, unspecified: Secondary | ICD-10-CM

## 2023-06-17 NOTE — Telephone Encounter (Signed)
Requested Prescriptions  Pending Prescriptions Disp Refills   levothyroxine (SYNTHROID) 100 MCG tablet [Pharmacy Med Name: Levothyroxine Sodium 100 MCG Oral Tablet] 90 tablet 2    Sig: TAKE 1 TABLET BY MOUTH ONCE DAILY BEFORE BREAKFAST     Endocrinology:  Hypothyroid Agents Passed - 06/17/2023  6:50 AM      Passed - TSH in normal range and within 360 days    TSH  Date Value Ref Range Status  01/03/2023 0.69 0.40 - 4.50 mIU/L Final         Passed - Valid encounter within last 12 months    Recent Outpatient Visits           5 months ago Annual physical exam   [place] [facility] [name], DO   11 months ago Pure hypercholesterolemia   [place] [facility] [name], DO   1 year ago Annual physical exam   [place] [facility] [name], DO   1 year ago Acquired hypothyroidism   [place] [facility] [name], DO       Future Appointments             In 7 months Althea Charon, Netta Neat, DO [place] [facility], [facility]

## 2023-06-22 ENCOUNTER — Ambulatory Visit
Admission: RE | Admit: 2023-06-22 | Discharge: 2023-06-22 | Disposition: A | Discharge status: Home / Self Care | Payer: 59 | Source: Ambulatory Visit | Attending: Family Medicine | Admitting: Family Medicine

## 2023-06-22 DIAGNOSIS — Z780 Asymptomatic menopausal state: Secondary | ICD-10-CM | POA: Insufficient documentation

## 2023-06-22 DIAGNOSIS — M810 Age-related osteoporosis without current pathological fracture: Secondary | ICD-10-CM | POA: Diagnosis not present

## 2023-06-27 ENCOUNTER — Ambulatory Visit
Admission: RE | Admit: 2023-06-27 | Discharge: 2023-06-27 | Disposition: A | Discharge status: Home / Self Care | Payer: 59 | Source: Ambulatory Visit | Attending: Acute Care | Admitting: Acute Care

## 2023-06-27 ENCOUNTER — Ambulatory Visit (INDEPENDENT_AMBULATORY_CARE_PROVIDER_SITE_OTHER): Payer: 59 | Admitting: Primary Care

## 2023-06-27 DIAGNOSIS — Z122 Encounter for screening for malignant neoplasm of respiratory organs: Secondary | ICD-10-CM

## 2023-06-27 DIAGNOSIS — Z87891 Personal history of nicotine dependence: Secondary | ICD-10-CM | POA: Diagnosis not present

## 2023-06-27 NOTE — Patient Instructions (Signed)

## 2023-06-27 NOTE — Progress Notes (Signed)
Virtual Visit via Video Note  I connected with Stacy Schmidt on 06/27/23 at  1:30 PM EDT by a video enabled telemedicine application and verified that I am speaking with the correct person using two identifiers.  Location: Patient: Home Provider: Office    I discussed the limitations of evaluation and management by telemedicine and the availability of in person appointments. The patient expressed understanding and agreed to proceed.   Shared Decision Making Visit Lung Cancer Screening Program [phone])   Eligibility: Age 72 y.o. Pack Years Smoking History Calculation 40 (# packs/per year x # years smoked) Recent History of coughing up blood  no Unexplained weight loss? no ( >Than 15 pounds within the last 6 months ) Prior History Lung / other cancer no (Diagnosis within the last 5 years already requiring surveillance chest CT Scans). Smoking Status Former Smoker Former Smokers: Years since quit: 2 years  Quit Date: 2022  Visit Components: Discussion included one or more decision making aids. yes Discussion included risk/benefits of screening. yes Discussion included potential follow up diagnostic testing for abnormal scans. yes Discussion included meaning and risk of over diagnosis. yes Discussion included meaning and risk of False Positives. yes Discussion included meaning of total radiation exposure. yes  Counseling Included: Importance of adherence to annual lung cancer LDCT screening. yes Impact of comorbidities on ability to participate in the program. yes Ability and willingness to under diagnostic treatment. yes  Smoking Cessation Counseling: Current Smokers:  Discussed importance of smoking cessation. yes Information about tobacco cessation classes and interventions provided to patient. yes Patient provided with "ticket" for LDCT Scan. NA Symptomatic Patient. no  Counseling(Intermediate counseling: > three minutes) 99406 Diagnosis Code: Tobacco Use  Z72.0 Asymptomatic Patient yes  Counseling (Intermediate counseling: > three minutes counseling) U1324 Former Smokers:  Discussed the importance of maintaining cigarette abstinence. yes Diagnosis Code: Personal History of Nicotine Dependence. M01.027 Information about tobacco cessation classes and interventions provided to patient. Yes Patient provided with "ticket" for LDCT Scan. NA Written Order for Lung Cancer Screening with LDCT placed in Epic. Yes (CT Chest Lung Cancer Screening Low Dose W/O CM) [id number] Z12.2-Screening of respiratory organs Z87.891-Personal history of nicotine dependence  I have spent 25 minutes of face to face/ virtual visit   time with Stacy Schmidt discussing the risks and benefits of lung cancer screening. We viewed / discussed a power point together that explained in detail the above noted topics. We paused at intervals to allow for questions to be asked and answered to ensure understanding.We discussed that the single most powerful action that she can take to decrease her risk of developing lung cancer is to quit smoking. We discussed whether or not she is ready to commit to setting a quit date. We discussed options for tools to aid in quitting smoking including nicotine replacement therapy, non-nicotine medications, support groups, Quit Smart classes, and behavior modification. We discussed that often times setting smaller, more achievable goals, such as eliminating 1 cigarette a day for a week and then 2 cigarettes a day for a week can be helpful in slowly decreasing the number of cigarettes smoked. This allows for a sense of accomplishment as well as providing a clinical benefit. I provided  her  with smoking cessation  information  with contact information for community resources, classes, free nicotine replacement therapy, and access to mobile apps, text messaging, and on-line smoking cessation help. I have also provided  her  the office contact information in the event  she needs  to contact me, or the screening staff. We discussed the time and location of the scan, and that either Abigail Miyamoto RN, Karlton Lemon, RN  or I will call / send a letter with the results within 24-72 hours of receiving them. The patient verbalized understanding of all of  the above and had no further questions upon leaving the office. They have my contact information in the event they have any further questions.  I spent 3-5 minutes counseling on smoking cessation and the health risks of continued tobacco abuse.  I explained to the patient that there has been a high incidence of coronary artery disease noted on these exams. I explained that this is a non-gated exam therefore degree or severity cannot be determined. This patient is not on statin therapy. I have asked the patient to follow-up with their PCP regarding any incidental finding of coronary artery disease and management with diet or medication as their PCP  feels is clinically indicated. The patient verbalized understanding of the above and had no further questions upon completion of the visit.  [facility]   Glenford Bayley, NP

## 2023-06-30 ENCOUNTER — Ambulatory Visit: Payer: Self-pay

## 2023-06-30 NOTE — Telephone Encounter (Signed)
  Chief Complaint: bilateral hand and finger pain and wrist pain - middle finger right hand with  Symptoms: severe pain  Frequency: chronic  Pertinent Negatives: Patient denies fever Disposition: [] ED /[] Urgent Care (no appt availability in office) / [x] Appointment(In office/virtual)/ []  [place] [facility]/ [] Home Care/ [] Refused Recommended Disposition /[] [place] [facility]/ []  Follow-up with PCP Additional Notes: has been taking Tylenol 2 weeks prn Orinda Kenner restart the Vita min D and fish oil needs order- please  Reason for Disposition  [1] SEVERE pain (e.g., excruciating, unable to use hand at all) AND [2] not improved after 2 hours of pain medicine  Answer Assessment - Initial Assessment Questions 1. ONSET: "When did the pain start?"  2. LOCATION: "Where is the pain located?"     Both hands, inky left hand wont bend - right thumb years ago had injury that never healed  3. PAIN: "How bad is the pain?" (Scale 1-10; or mild, moderate, severe)   - MILD (1-3): doesn't interfere with normal activities   - MODERATE (4-7): interferes with normal activities (e.g., work or school) or awakens from sleep   - SEVERE (8-10): excruciating pain, unable to use hand at all     Left hand 9/10  right hand not as bad  4. WORK OR EXERCISE: "Has there been any recent work or exercise that involved this part (i.e., hand or wrist) of the body?"     Everyday activities 5. CAUSE: "What do you think is causing the pain?"     chronic 6. AGGRAVATING FACTORS: "What makes the pain worse?" (e.g., using computer)     Using hands 7. OTHER SYMPTOMS: "Do you have any other symptoms?" (e.g., neck pain, swelling, rash, numbness, fever)     Multiple areas of pain was taking Ca  and stopped, wrists  8. PREGNANCY: "Is there any chance you are pregnant?" "When was your last menstrual period?"     N/a  Protocols used: Hand and Wrist Pain-A-AH

## 2023-06-30 NOTE — Telephone Encounter (Signed)
Advised pt that Dr. Althea Charon does not have any appointments sooner.  She states she does not mind waiting until 09/30.   Thanks,   -Vernona Rieger

## 2023-07-11 ENCOUNTER — Other Ambulatory Visit: Payer: Self-pay | Admitting: Acute Care

## 2023-07-11 DIAGNOSIS — Z122 Encounter for screening for malignant neoplasm of respiratory organs: Secondary | ICD-10-CM

## 2023-07-11 DIAGNOSIS — Z87891 Personal history of nicotine dependence: Secondary | ICD-10-CM

## 2023-07-18 ENCOUNTER — Encounter: Payer: Self-pay | Admitting: Family Medicine

## 2023-07-18 ENCOUNTER — Ambulatory Visit (INDEPENDENT_AMBULATORY_CARE_PROVIDER_SITE_OTHER): Payer: 59 | Admitting: Family Medicine

## 2023-07-18 VITALS — BP 138/82 | HR 65 | Wt 167.0 lb

## 2023-07-18 DIAGNOSIS — Z23 Encounter for immunization: Secondary | ICD-10-CM | POA: Diagnosis not present

## 2023-07-18 DIAGNOSIS — M19042 Primary osteoarthritis, left hand: Secondary | ICD-10-CM

## 2023-07-18 DIAGNOSIS — M19041 Primary osteoarthritis, right hand: Secondary | ICD-10-CM | POA: Diagnosis not present

## 2023-07-18 MED ORDER — NAPROXEN 500 MG PO TABS
500.0000 mg | ORAL_TABLET | Freq: Two times a day (BID) | ORAL | 2 refills | Status: DC
Start: 2023-07-18 — End: 2023-10-10

## 2023-07-18 NOTE — Progress Notes (Signed)
Subjective:    Patient ID: Stacy Schmidt, female    DOB: 12/03/1950, 72 y.o.   MRN: [id number]  Stacy OSHANA is a 72 y.o. female presenting on 07/18/2023 for Hand Pain (Started a few weeks ago. )   HPI  Discussed the use of AI scribe software for clinical note transcription with the patient, who gave verbal consent to proceed.  History of Present Illness    Osteoarthritis Hands Fingers L>R  R Hand dominant.  The patient presents with bilateral hand pain, more severe in the left hand, which they attribute to arthritis. They report a history of falls, which they believe may have contributed to the current symptoms. The pain is particularly noticeable in the knuckles and fingers, with visible swelling and limited range of motion. The patient also reports a sensation of 'locking up' when using the affected hand, mostly with L middle finger difficulty moving into fist.  The patient has been managing the pain with over-the-counter Tylenol Ext Str 500mg  x 2 = 1000mg  TID and ibuprofen 200mg  x 2 = 400mg  THREE TIMES A DAY, with the latter providing more relief. They have also tried a topical treatment (Voltaren), but found it difficult to maintain due to frequent hand washing.  In addition to pharmacological interventions, the patient has been using various physical aids to manage the pain, including finger splints, compression gloves, and wristbands. These measures provide some relief, but the pain often disrupts their sleep.  The patient has a history of a similar issue with a 'frozen' finger years ago, which was successfully treated with a cortisone shot. They express a willingness to consider similar interventions if the current management strategies do not provide sufficient relief.  The patient's hand pain is significantly impacting their daily activities, including yard work. They express a desire for more effective pain management strategies.      Also PMH Osteoarthritis  Knees      01/06/2023    1:30 PM 01/04/2022    2:29 PM 12/30/2021    3:03 PM  Depression screen PHQ 2/9  Decreased Interest 0 0 0  Down, Depressed, Hopeless 0 0 0  PHQ - 2 Score 0 0 0  Altered sleeping 0 0 0  Tired, decreased energy 0 0 3  Change in appetite 0 0 0  Feeling bad or failure about yourself  0 0 0  Trouble concentrating 0 0 0  Moving slowly or fidgety/restless 0 0 0  Suicidal thoughts 0 0 0  PHQ-9 Score 0 0 3  Difficult doing work/chores Not difficult at all Not difficult at all Not difficult at all    Social History   Tobacco Use   Smoking status: Former    Current packs/day: 1.00    Average packs/day: 1 pack/day for 50.0 years (50.0 ttl pk-yrs)    Types: Cigarettes   Smokeless tobacco: Never  Vaping Use   Vaping status: Never Used  Substance Use Topics   Alcohol use: Not Currently    Review of Systems Per HPI unless specifically indicated above     Objective:    BP 138/82 (BP Location: Left Arm, Cuff Size: Normal)   Pulse 65   Wt 167 lb (75.8 kg)   SpO2 97%   BMI 27.79 kg/m   Wt Readings from Last 3 Encounters:  07/18/23 167 lb (75.8 kg)  01/10/23 159 lb (72.1 kg)  01/06/23 161 lb (73 kg)    Physical Exam Vitals and nursing note reviewed.  Constitutional:  General: She is not in acute distress.    Appearance: Normal appearance. She is well-developed. She is not diaphoretic.     Comments: Well-appearing, comfortable, cooperative  HENT:     Head: Normocephalic and atraumatic.  Eyes:     General:        Right eye: No discharge.        Left eye: No discharge.     Conjunctiva/sclera: Conjunctivae normal.  Cardiovascular:     Rate and Rhythm: Normal rate.  Pulmonary:     Effort: Pulmonary effort is normal.  Musculoskeletal:     Comments: Bilateral Hands with index middle ring fingers mostly L>R with some mild warmth and edema. Has reduced ROM L middle finger flexion.  Skin:    General: Skin is warm and dry.     Findings: No erythema  or rash.  Neurological:     Mental Status: She is alert and oriented to person, place, and time.  Psychiatric:        Mood and Affect: Mood normal.        Behavior: Behavior normal.        Thought Content: Thought content normal.     Comments: Well groomed, good eye contact, normal speech and thoughts    Results for orders placed or performed in visit on 01/10/23  Cologuard  Result Value Ref Range   COLOGUARD Negative Negative      Assessment & Plan:   Problem List Items Addressed This Visit   None Visit Diagnoses     Osteoarthritis of fingers of hands, bilateral    -  Primary   Relevant Medications   naproxen (NAPROSYN) 500 MG tablet   Need for influenza vaccination       Relevant Orders   Flu Vaccine Trivalent High Dose (Fluad) (Completed)       Assessment and Plan    Arthritis of Hands Bilateral hand pain, worse on the left, with swelling and limited range of motion. Suspected overuse due to right hand dominance. Patient has tried Tylenol and ibuprofen with partial relief.  -Continue Tylenol 1000mg  THREE TIMES A DAY PRN  Start Naproxen 500mg  twice a day with meal for 2 weeks then intermittent can repeat course as needed, with interval break off NSAID.  Discontinue Ibuprofen.  Okay to use Voltaren topical AS NEEDED  -Consider Xray hands + referral to orthopedics and possible cortisone injection if pain persists despite medication.     Orders Placed This Encounter  Procedures   Flu Vaccine Trivalent High Dose (Fluad)     Meds ordered this encounter  Medications   naproxen (NAPROSYN) 500 MG tablet    Sig: Take 1 tablet (500 mg total) by mouth 2 (two) times daily with a meal. For up to 2 weeks then as needed    Dispense:  60 tablet    Refill:  2      Follow up plan: Return if symptoms worsen or fail to improve.   Saralyn Pilar, DO [facility] 07/18/2023, 11:43 AM

## 2023-07-18 NOTE — Patient Instructions (Addendum)
Thank you for coming to the office today.  Osteoarthritis of fingers / hands  The inflammation can cause the pain.  It is safe to take Tylenol Acetaminophen Extra Strength 500mg  x 2 = 1000mg  up to 3 times per day as needed. You can take this longer than 7 days as needed.  Recommend trial of Anti-inflammatory with Naproxen (Naprosyn) 500mg  tabs - take one with food and plenty of water TWICE daily every day (breakfast and dinner), for next 2 weeks, then you may take only as needed - DO NOT TAKE any ibuprofen, aleve, motrin while you are taking this medicine  We can consider X-ray in the future and Orthopedics for consider injections if not improving.  Please schedule a Follow-up Appointment to: Return if symptoms worsen or fail to improve.  If you have any other questions or concerns, please feel free to call the office or send a message through MyChart. You may also schedule an earlier appointment if necessary.  Additionally, you may be receiving a survey about your experience at our office within a few days to 1 week by e-mail or mail. We value your feedback.  Saralyn Pilar, DO [facility], [place]

## 2023-09-08 ENCOUNTER — Ambulatory Visit: Payer: Self-pay

## 2023-09-08 NOTE — Telephone Encounter (Signed)
I am not sure I have much to add without evaluating her at a visit.  I agree with general conservative care for muscle or back or leg pain.  Tylenol Ext Str 500mg  x 2 = 1000mg  THREE TIMES A DAY AS NEEDED Heating pad Muscle rub, can try Voltaren OTC Stretches, if thinking it is Sciatica - she should focus on low back stretches if tolerable Avoid sitting prolonged with direct pressure on the buttock area if there is something pinching Muscle cramps can use mustard remedy tablespoon yellow mustard by mouth for cramp relief, or electrolytes / improve hydration  Saralyn Pilar, DO [facility] 09/08/2023, 4:05 PM

## 2023-09-08 NOTE — Telephone Encounter (Signed)
Chief Complaint: Buttock pain  Symptoms: buttock pain on the right side 5/10 Frequency: constant  Pertinent Negatives: Patient denies injury, radiating pain, nausea, vomiting, falls Disposition: [] ED /[] Urgent Care (no appt availability in office) / [] Appointment(In office/virtual)/ []  [place] [facility]/ [x] Home Care/ [] Refused Recommended Disposition /[] [place] [facility]/ []  Follow-up with PCP Additional Notes: Patient states she has had some discomfort in her buttock on the right side for about 2 weeks now. Patient states it hurts to put too much pressure on that side when sitting and told it was hurting when she went for walk. Patient report as taking tylenol for the discomfort and it is helpful but she does not know what is causing the pain. Care advice was given and patient was offered an appointment with PCP. Patient stated she would try some of the home care recommendations first and callback if symptoms get worse or do not improve. Patient also asked if PCP had any additional recommendations she can try before scheduling an appointment.   Reason for Disposition  [1] MODERATE back pain (e.g., interferes with normal activities) AND [2] present > 3 days  Answer Assessment - Initial Assessment Questions 1. ONSET: "When did the pain begin?"      2 weeks ago 2. LOCATION: "Where does it hurt?" (upper, mid or lower back)     Lower back into the buttocks right side  3. SEVERITY: "How bad is the pain?"  (e.g., Scale 1-10; mild, moderate, or severe)   - MILD (1-3): Doesn't interfere with normal activities.    - MODERATE (4-7): Interferes with normal activities or awakens from sleep.    - SEVERE (8-10): Excruciating pain, unable to do any normal activities.      5/10 4. PATTERN: "Is the pain constant?" (e.g., yes, no; constant, intermittent)      Constant but different pain levels  5. RADIATION: "Does the pain shoot into your legs or somewhere else?"     No  6. CAUSE:  "What do you  think is causing the back pain?"      I'm not sure maybe sciatica  7. BACK OVERUSE:  "Any recent lifting of heavy objects, strenuous work or exercise?"     No  8. MEDICINES: "What have you taken so far for the pain?" (e.g., nothing, acetaminophen, NSAIDS)     Tylenol  9. NEUROLOGIC SYMPTOMS: "Do you have any weakness, numbness, or problems with bowel/bladder control?"     No  10. OTHER SYMPTOMS: "Do you have any other symptoms?" (e.g., fever, abdomen pain, burning with urination, blood in urine)       Leg cramps at night  Protocols used: Back Pain-A-AH

## 2023-09-09 NOTE — Telephone Encounter (Signed)
Advised patient per Dr. Althea Charon. She states she has been doing all of the advised. Will continue to monitor and treat at home. Patient will call back for an appointment if symptoms worsen or fail to improve.

## 2023-10-08 ENCOUNTER — Other Ambulatory Visit: Payer: Self-pay | Admitting: Family Medicine

## 2023-10-08 DIAGNOSIS — M19041 Primary osteoarthritis, right hand: Secondary | ICD-10-CM

## 2023-10-10 NOTE — Telephone Encounter (Signed)
Requested Prescriptions  Pending Prescriptions Disp Refills   naproxen (NAPROSYN) 500 MG tablet [Pharmacy Med Name: Naproxen 500 MG Oral Tablet] 60 tablet 0    Sig: TAKE 1 TABLET BY MOUTH TWICE DAILY WITH A MEAL FOR UP TO 2 WEEKS, THEN TAKE AS NEEDED     Analgesics:  NSAIDS Failed - 10/10/2023  1:44 PM      Failed - Manual Review: Labs are only required if the patient has taken medication for more than 8 weeks.      Passed - Cr in normal range and within 360 days    Creat  Date Value Ref Range Status  01/03/2023 0.60 0.60 - 1.00 mg/dL Final         Passed - HGB in normal range and within 360 days    Hemoglobin  Date Value Ref Range Status  01/03/2023 13.1 11.7 - 15.5 g/dL Final         Passed - PLT in normal range and within 360 days    Platelets  Date Value Ref Range Status  01/03/2023 291 140 - 400 Thousand/uL Final         Passed - HCT in normal range and within 360 days    HCT  Date Value Ref Range Status  01/03/2023 39.3 35.0 - 45.0 % Final         Passed - eGFR is 30 or above and within 360 days    GFR calc Af Amer  Date Value Ref Range Status  06/07/2020 >60 >60 mL/min Final   GFR calc non Af Amer  Date Value Ref Range Status  06/07/2020 59 (L) >60 mL/min Final   eGFR  Date Value Ref Range Status  01/03/2023 95 > OR = 60 mL/min/1.23m2 Final         Passed - Patient is not pregnant      Passed - Valid encounter within last 12 months    Recent Outpatient Visits           2 months ago Osteoarthritis of fingers of hands, bilateral   [place] [facility], Netta Neat, DO   9 months ago Annual physical exam   [place] [facility] [name], DO   1 year ago Pure hypercholesterolemia   [place] [facility] [name], DO   1 year ago Annual physical exam   [place] [facility] [name], DO   1 year ago Acquired hypothyroidism    [place] [facility] [name], DO       Future Appointments             In 3 months Althea Charon, Netta Neat, DO [facility], [facility]            Called pharmacy and advised no refills

## 2023-10-25 ENCOUNTER — Ambulatory Visit: Payer: Self-pay

## 2023-10-25 NOTE — Telephone Encounter (Signed)
 Chief Complaint: Chest Pain  Symptoms: Mid chest pain between the breast, mild SOB Frequency: Constant  Pertinent Negatives: Patient denies fever, nausea, vomiting, neck pain, shoulder or arm pain, jaw pain  Disposition: [] ED /[] Urgent Care (no appt availability in office) / [x] Appointment(In office/virtual)/ []  [place] [facility]/ [] Home Care/ [] Refused Recommended Disposition /[] [place] [facility]/ []  Follow-up with PCP Additional Notes: Patient states she has moderate to severe chest pain that varies in severity. Patient states the chest pain started last Wednesday and has not improved much. The chest pain radiates to her back as well. Patient also reports mild SOB when the chest pain is severe. Patients states the chest hurts the most when she bends over. Patient reports taking tylenol  3 times daily.  Care advice given and patient has been scheduled to see PCP tomorrow. Advised if chest pain gets worse to go to the ED. Patient states she does not like going to the ED, she will come to the office at scheduled appointment time tomorrow. Reason for Disposition  [1] Chest pain(s) lasting a few seconds AND [2] persists > 3 days  Answer Assessment - Initial Assessment Questions 1. LOCATION: Where does it hurt?       Center of the chest between the breast  2. RADIATION: Does the pain go anywhere else? (e.g., into neck, jaw, arms, back)     To my back  3. ONSET: When did the chest pain begin? (Minutes, hours or days)      Almost a week ago  4. PATTERN: Does the pain come and go, or has it been constant since it started?  Does it get worse with exertion?      Comes and goes, worse with exertion  5. DURATION: How long does it last (e.g., seconds, minutes, hours)     It's always there but the level of pain changes  6. SEVERITY: How bad is the pain?  (e.g., Scale 1-10; mild, moderate, or severe)    - MILD (1-3): doesn't interfere with normal activities     - MODERATE (4-7):  interferes with normal activities or awakens from sleep    - SEVERE (8-10): excruciating pain, unable to do any normal activities       Moderate to Severe 7. CARDIAC RISK FACTORS: Do you have any history of heart problems or risk factors for heart disease? (e.g., angina, prior heart attack; diabetes, high blood pressure, high cholesterol, smoker, or strong family history of heart disease)     Prediabetic, former smoker  8. PULMONARY RISK FACTORS: Do you have any history of lung disease?  (e.g., blood clots in lung, asthma, emphysema, birth control pills)     No  9. CAUSE: What do you think is causing the chest pain?     I don't know  10. OTHER SYMPTOMS: Do you have any other symptoms? (e.g., dizziness, nausea, vomiting, sweating, fever, difficulty breathing, cough)       SOB, left arm tingling at times  Protocols used: Chest Pain-A-AH

## 2023-10-26 ENCOUNTER — Ambulatory Visit (INDEPENDENT_AMBULATORY_CARE_PROVIDER_SITE_OTHER): Payer: 59 | Admitting: Family Medicine

## 2023-10-26 ENCOUNTER — Ambulatory Visit
Admission: RE | Admit: 2023-10-26 | Discharge: 2023-10-26 | Disposition: A | Discharge status: Home / Self Care | Payer: 59 | Source: Ambulatory Visit | Attending: Family Medicine | Admitting: Family Medicine

## 2023-10-26 ENCOUNTER — Ambulatory Visit
Admission: RE | Admit: 2023-10-26 | Discharge: 2023-10-26 | Disposition: A | Discharge status: Home / Self Care | Payer: 59 | Attending: Family Medicine | Admitting: Family Medicine

## 2023-10-26 ENCOUNTER — Encounter: Payer: Self-pay | Admitting: Family Medicine

## 2023-10-26 VITALS — BP 140/70 | HR 84 | Ht 65.0 in | Wt 162.0 lb

## 2023-10-26 DIAGNOSIS — R0600 Dyspnea, unspecified: Secondary | ICD-10-CM

## 2023-10-26 DIAGNOSIS — R079 Chest pain, unspecified: Secondary | ICD-10-CM

## 2023-10-26 DIAGNOSIS — I700 Atherosclerosis of aorta: Secondary | ICD-10-CM | POA: Diagnosis not present

## 2023-10-26 DIAGNOSIS — R0602 Shortness of breath: Secondary | ICD-10-CM | POA: Diagnosis not present

## 2023-10-26 LAB — CBC WITH DIFFERENTIAL/PLATELET
Absolute Lymphocytes: 2679 {cells}/uL (ref 850–3900)
Absolute Monocytes: 673 {cells}/uL (ref 200–950)
Basophils Absolute: 23 {cells}/uL (ref 0–200)
Basophils Relative: 0.4 %
Eosinophils Absolute: 68 {cells}/uL (ref 15–500)
Eosinophils Relative: 1.2 %
HCT: 39.3 % (ref 35.0–45.0)
Hemoglobin: 13 g/dL (ref 11.7–15.5)
MCH: 30.2 pg (ref 27.0–33.0)
MCHC: 33.1 g/dL (ref 32.0–36.0)
MCV: 91.2 fL (ref 80.0–100.0)
MPV: 9.5 fL (ref 7.5–12.5)
Monocytes Relative: 11.8 %
Neutro Abs: 2257 {cells}/uL (ref 1500–7800)
Neutrophils Relative %: 39.6 %
Platelets: 290 10*3/uL (ref 140–400)
RBC: 4.31 10*6/uL (ref 3.80–5.10)
RDW: 12.4 % (ref 11.0–15.0)
Total Lymphocyte: 47 %
WBC: 5.7 10*3/uL (ref 3.8–10.8)

## 2023-10-26 LAB — COMPLETE METABOLIC PANEL WITH GFR
AG Ratio: 2.1 (calc) (ref 1.0–2.5)
ALT: 95 U/L — ABNORMAL HIGH (ref 6–29)
AST: 78 U/L — ABNORMAL HIGH (ref 10–35)
Albumin: 4.8 g/dL (ref 3.6–5.1)
Alkaline phosphatase (APISO): 76 U/L (ref 37–153)
BUN: 10 mg/dL (ref 7–25)
CO2: 27 mmol/L (ref 20–32)
Calcium: 9.7 mg/dL (ref 8.6–10.4)
Chloride: 101 mmol/L (ref 98–110)
Creat: 0.65 mg/dL (ref 0.60–1.00)
Globulin: 2.3 g/dL (ref 1.9–3.7)
Glucose, Bld: 118 mg/dL (ref 65–139)
Potassium: 4.1 mmol/L (ref 3.5–5.3)
Sodium: 137 mmol/L (ref 135–146)
Total Bilirubin: 0.6 mg/dL (ref 0.2–1.2)
Total Protein: 7.1 g/dL (ref 6.1–8.1)
eGFR: 93 mL/min/{1.73_m2} (ref >=60)

## 2023-10-26 LAB — D-DIMER, QUANTITATIVE: D-Dimer, Quant: 0.4 ug{FEU}/mL (ref <0.50)

## 2023-10-26 LAB — TROPONIN I: Troponin I: <3 ng/L (ref <=47)

## 2023-10-26 NOTE — Patient Instructions (Addendum)
 Thank you for coming to the office today.  Stay tuned for labs. And X-ray  If you have any significant chest pain that does not go away within 30 minutes, is accompanied by nausea, sweating, shortness of breath, or made worse by activity, this may be evidence of a heart attack, especially if symptoms worsening instead of improving, please call 911 or go directly to the emergency room immediately for evaluation.  Please schedule a Follow-up Appointment to: Return if symptoms worsen or fail to improve.  If you have any other questions or concerns, please feel free to call the office or send a message through MyChart. You may also schedule an earlier appointment if necessary.  Additionally, you may be receiving a survey about your experience at our office within a few days to 1 week by e-mail or mail. We value your feedback.  Marsa Officer, DO [facility], [place]

## 2023-10-26 NOTE — Telephone Encounter (Signed)
 Understood. I will see her at her apt today, however there is a good chance that we may still recommend she go to the ED depending on severity of her symptoms.  Please let me know when she arrives. She will need STAT Chest X-ray, EKG and labs upon arrival.  Marsa Officer, DO [facility] 10/26/2023, 8:18 AM

## 2023-10-26 NOTE — Progress Notes (Signed)
 Subjective:    Patient ID: Stacy Schmidt, female    DOB: 1951-01-29, 73 y.o.   MRN: [id number]  Stacy Schmidt is a 73 y.o. female presenting on 10/26/2023 for Chest Pain  Patient presents for a same day appointment.   HPI  Discussed the use of AI scribe software for clinical note transcription with the patient, who gave verbal consent to proceed.  History of Present Illness    Chest Pain vs Epigastric Pain Dyspnea  Note triage call earlier today. Patient was initially advised ED but she has declined and scheduled apt today acutely.  The patient, with a history of arthritis, presented with persistent chest pain and shortness of breath that has been ongoing for approximately a week. 1 week ago she woke up with the pain overnight. The pain, described as a pressing sensation, is located centrally in the lower to mid chest region and radiates to the back. The discomfort is exacerbated by bending over and does not seem to be influenced by physical activity or eating. The patient also reported feeling winded more quickly during physical activity, such as riding a bike, but denied any associated coughing.  Reduced appetite. Not taking in as much.  In addition to the chest discomfort, the patient reported a tingling sensation in the left upper arm, akin to the feeling of the limb falling asleep. However, this sensation does not extend beyond the upper arm. The patient also mentioned a history of smoking  The patient has been managing arthritis with Tylenol  and a trial of Naproxen , which was discontinued due to lack of efficacy. The patient also reported a long-term use of Omeprazole  40mg  for stomach acid management. Despite these medications, the patient reported no relief from the current chest pain.  The patient also mentioned a recent issue with pain in the left buttock region, which was described as bone pain. However, it was unclear whether this pain was related to the current chest  discomfort. The patient's appetite has been poor, with a reported significant weight loss and minimal daily food intake.   Denies cough fever chills sinus drainage sputum production wheezing nausea vomiting sweating      10/26/2023   11:33 AM 01/06/2023    1:30 PM 01/04/2022    2:29 PM  Depression screen PHQ 2/9  Decreased Interest 0 0 0  Down, Depressed, Hopeless 0 0 0  PHQ - 2 Score 0 0 0  Altered sleeping 0 0 0  Tired, decreased energy 2 0 0  Change in appetite 3 0 0  Feeling bad or failure about yourself  0 0 0  Trouble concentrating 0 0 0  Moving slowly or fidgety/restless 0 0 0  Suicidal thoughts 0 0 0  PHQ-9 Score 5 0 0  Difficult doing work/chores  Not difficult at all Not difficult at all       10/26/2023   11:34 AM 12/30/2021    3:04 PM  GAD 7 : Generalized Anxiety Score  Nervous, Anxious, on Edge 0 0  Control/stop worrying 0 0  Worry too much - different things 0 0  Trouble relaxing 0 0  Restless 0 0  Easily annoyed or irritable 0 0  Afraid - awful might happen 0 0  Total GAD 7 Score 0 0  Anxiety Difficulty  Not difficult at all    Social History   Tobacco Use   Smoking status: Former    Current packs/day: 1.00    Average packs/day: 1 pack/day for 50.0  years (50.0 ttl pk-yrs)    Types: Cigarettes   Smokeless tobacco: Never  Vaping Use   Vaping status: Never Used  Substance Use Topics   Alcohol use: Not Currently    Review of Systems Per HPI unless specifically indicated above     Objective:    BP (!) 140/70 (BP Location: Left Arm, Cuff Size: Normal)   Pulse 84   Ht 5' 5 (1.651 m)   Wt 162 lb (73.5 kg)   SpO2 97%   BMI 26.96 kg/m   Wt Readings from Last 3 Encounters:  10/26/23 162 lb (73.5 kg)  07/18/23 167 lb (75.8 kg)  01/10/23 159 lb (72.1 kg)    Physical Exam Vitals and nursing note reviewed.  Constitutional:      General: She is not in acute distress.    Appearance: She is well-developed. She is not diaphoretic.     Comments:  Well-appearing but mild discomfort and anxious  HENT:     Head: Normocephalic and atraumatic.  Eyes:     General:        Right eye: No discharge.        Left eye: No discharge.     Conjunctiva/sclera: Conjunctivae normal.  Neck:     Thyroid : No thyromegaly.  Cardiovascular:     Rate and Rhythm: Normal rate and regular rhythm.     Heart sounds: Normal heart sounds. No murmur heard. Pulmonary:     Effort: Pulmonary effort is normal. No respiratory distress.     Breath sounds: Normal breath sounds. No wheezing or rales.  Chest:     Chest wall: Tenderness (lower substernal vs epigastric region.) present.  Musculoskeletal:        General: Normal range of motion.     Cervical back: Normal range of motion and neck supple.     Right lower leg: No edema.     Left lower leg: No edema.  Lymphadenopathy:     Cervical: No cervical adenopathy.  Skin:    General: Skin is warm and dry.     Findings: No erythema or rash.  Neurological:     Mental Status: She is alert and oriented to person, place, and time.  Psychiatric:        Behavior: Behavior normal.     Comments: Anxious. Well groomed, good eye contact, normal speech and thoughts     EKG - performed in office today  Date: 10/26/23  Rate: Sinus  Rhythm: normal sinus rhythm  QRS Axis: normal  Intervals: normal  ST/T Wave abnormalities: nonspecific T wave changes  Conduction Disutrbances:none  Additional Narrative Interpretation: borderline T wave inversion V1 V2  Old EKG Reviewed: changes noted compared to 06/04/20  I have personally reviewed the radiology report from 10/26/23 on CXR STAT.  CLINICAL DATA:  Shortness of breath and chest pain.   EXAM: CHEST - 2 VIEW   COMPARISON:  Chest CT dated 06/27/2023.   FINDINGS: No focal consolidation, pleural effusion, or pneumothorax. The cardiac silhouette is within normal limits. Atherosclerotic calcification of the aortic arch. No acute osseous pathology.   IMPRESSION: No active  cardiopulmonary disease.     Electronically Signed   By: Vanetta Chou M.D.   On: 10/26/2023 12:21  Results for orders placed or performed in visit on 10/26/23  CBC with Differential/Platelet   Collection Time: 10/26/23 11:03 AM  Result Value Ref Range   WBC 5.7 3.8 - 10.8 Thousand/uL   RBC 4.31 3.80 - 5.10 Million/uL   Hemoglobin  13.0 11.7 - 15.5 g/dL   HCT 60.6 64.9 - 54.9 %   MCV 91.2 80.0 - 100.0 fL   MCH 30.2 27.0 - 33.0 pg   MCHC 33.1 32.0 - 36.0 g/dL   RDW 87.5 88.9 - 84.9 %   Platelets 290 140 - 400 Thousand/uL   MPV 9.5 7.5 - 12.5 fL   Neutro Abs 2,257 1,500 - 7,800 cells/uL   Absolute Lymphocytes 2,679 850 - 3,900 cells/uL   Absolute Monocytes 673 200 - 950 cells/uL   Eosinophils Absolute 68 15 - 500 cells/uL   Basophils Absolute 23 0 - 200 cells/uL   Neutrophils Relative % 39.6 %   Total Lymphocyte 47.0 %   Monocytes Relative 11.8 %   Eosinophils Relative 1.2 %   Basophils Relative 0.4 %  COMPLETE METABOLIC PANEL WITH GFR   Collection Time: 10/26/23 11:03 AM  Result Value Ref Range   Glucose, Bld 118 65 - 139 mg/dL   BUN 10 7 - 25 mg/dL   Creat 9.34 9.39 - 8.99 mg/dL   eGFR 93 > OR = 60 fO/fpw/8.26f7   BUN/Creatinine Ratio SEE NOTE: 6 - 22 (calc)   Sodium 137 135 - 146 mmol/L   Potassium 4.1 3.5 - 5.3 mmol/L   Chloride 101 98 - 110 mmol/L   CO2 27 20 - 32 mmol/L   Calcium 9.7 8.6 - 10.4 mg/dL   Total Protein 7.1 6.1 - 8.1 g/dL   Albumin 4.8 3.6 - 5.1 g/dL   Globulin 2.3 1.9 - 3.7 g/dL (calc)   AG Ratio 2.1 1.0 - 2.5 (calc)   Total Bilirubin 0.6 0.2 - 1.2 mg/dL   Alkaline phosphatase (APISO) 76 37 - 153 U/L   AST 78 (H) 10 - 35 U/L   ALT 95 (H) 6 - 29 U/L  Troponin I   Collection Time: 10/26/23 11:03 AM  Result Value Ref Range   Troponin I <3 < OR = 47 ng/L  D-Dimer, Quantitative   Collection Time: 10/26/23 11:03 AM  Result Value Ref Range   D-Dimer, Quant 0.40 <0.50 mcg/mL FEU      Assessment & Plan:   Problem List Items Addressed This  Visit   None Visit Diagnoses       Chest pain, unspecified type    -  Primary   Relevant Orders   CBC with Differential/Platelet (Completed)   COMPLETE METABOLIC PANEL WITH GFR (Completed)   DG Chest 2 View (Completed)   Troponin I (Completed)   D-Dimer, Quantitative (Completed)     Dyspnea, unspecified type       Relevant Orders   COMPLETE METABOLIC PANEL WITH GFR (Completed)   DG Chest 2 View (Completed)   D-Dimer, Quantitative (Completed)         Chest Pain Persistent central chest pain with radiation to the back, described as pressure-like. Pain is exacerbated by bending over. Associated with shortness of breath and left upper arm tingling. EKG shows some borderline T wave inversions V1-V2, compared to last EKG 2021  Discussion with patient today that we are evaluating her for acute concerning issues after she has declined hospital ED by our triage. We have submitted several STAT orders for work up, and if she has any worsening symptoms suddenly, she needs to seek care immediately at hospital ED. We will get results as fast as we can in outpatient setting.  - EKG today - STAT Chest X-ray (results updated after visit, Negative) -Complete blood work including cardiac enzymes to rule  out myocardial infarction, also D-Dimer checked today -Advise patient to seek immediate medical attention if symptoms worsen.  -Consider referral to cardiology pending results of blood work.  -Advise patient to continue taking Tylenol  as needed for pain. -Consider adding sucralfate for potential ulcer-related pain if cardiac work up is negative     Update 10/26/23 515pm Reviewed all STAT Lab results and CXR. Results show: - Negative Troponin, unlikely MI - Negative D-Dimer, unlikely blood clot PE - Chemistry normal electrolytes kidney glucose. Some stable mild elevated Liver enzymes, not a new problem, unrelated it seems - CBC normal without anemia - CXR negative  I called patient back and  discussed results with her directly. I am reassured by the negative acute work up today. Unfortunately limited answers for her symptoms, we discussed possibility of MSK etiology chest wall ribs or other and also consider digestive related PUD Epigastric region pain. Seems unrelated to eating and she does not feel indigestion, so seems less likely. She may retry tylenol , ibuprofen  dosing adjusting, monitor symptoms further. - I advised be cautious and monitor closely and if worsening symptoms or persistent without any improve over next 24-72 hours, seek care immediately hospital ED - I offered future referral to Cardiology and we agreed to pause on new referral today given initial negative work up. This would be next option if need further evaluation.   Orders Placed This Encounter  Procedures   DG Chest 2 View    Standing Status:   Future    Number of Occurrences:   1    Expiration Date:   10/25/2024    Reason for Exam (SYMPTOM  OR DIAGNOSIS REQUIRED):   chest pain, dyspnea    Preferred imaging location?:   [facility]   CBC with Differential/Platelet   COMPLETE METABOLIC PANEL WITH GFR   Troponin I   D-Dimer, Quantitative    No orders of the defined types were placed in this encounter.   Follow up plan: Return if symptoms worsen or fail to improve.   Marsa Officer, DO [facility] 10/26/2023, 11:33 AM

## 2023-10-27 NOTE — Telephone Encounter (Signed)
 Copied from CRM [id number]. Topic: General - Call Back - No Documentation >> Oct 26, 2023  4:44 PM Stacy Schmidt wrote: Reason for CRM: Pt received call from office at 4:33pm and told to call back. No documentation seen.   Please assist pt further,  [phone]

## 2023-10-27 NOTE — Telephone Encounter (Signed)
 Copied from CRM [id number]. Topic: General - Other >> Oct 27, 2023 12:03 PM Turkey B wrote: Reason for CRM: pt called in returning call to [facility], not sure what its about, but I did reschedule her AWV

## 2023-12-17 ENCOUNTER — Other Ambulatory Visit: Payer: Self-pay | Admitting: Family Medicine

## 2023-12-17 DIAGNOSIS — E039 Hypothyroidism, unspecified: Secondary | ICD-10-CM

## 2023-12-19 NOTE — Telephone Encounter (Signed)
 Requested Prescriptions  Pending Prescriptions Disp Refills   levothyroxine (SYNTHROID) 100 MCG tablet [Pharmacy Med Name: Levothyroxine Sodium 100 MCG Oral Tablet] 90 tablet 0    Sig: TAKE 1 TABLET BY MOUTH ONCE DAILY BEFORE BREAKFAST     Endocrinology:  Hypothyroid Agents Passed - 12/19/2023  2:06 PM      Passed - TSH in normal range and within 360 days    TSH  Date Value Ref Range Status  01/03/2023 0.69 0.40 - 4.50 mIU/L Final         Passed - Valid encounter within last 12 months    Recent Outpatient Visits           1 month ago Chest pain, unspecified type   [facility] [name], DO   5 months ago Osteoarthritis of fingers of hands, bilateral   [place] [facility] [name], DO   11 months ago Annual physical exam   [place] [facility] [name], DO   1 year ago Pure hypercholesterolemia   [place] [facility] [name], DO   1 year ago Annual physical exam   [place] [facility] [name], DO       Future Appointments             In 4 weeks Althea Charon, Netta Neat, DO [facility]

## 2023-12-26 ENCOUNTER — Other Ambulatory Visit: Payer: Self-pay | Admitting: Family Medicine

## 2023-12-26 DIAGNOSIS — K219 Gastro-esophageal reflux disease without esophagitis: Secondary | ICD-10-CM

## 2023-12-26 MED ORDER — OMEPRAZOLE 40 MG PO CPDR
40.0000 mg | DELAYED_RELEASE_CAPSULE | Freq: Every day | ORAL | 3 refills | Status: AC
Start: 2023-12-26 — End: ?

## 2023-12-26 NOTE — Telephone Encounter (Signed)
 Copied from CRM [id number]. Topic: Clinical - Medication Refill >> Dec 26, 2023  2:49 PM Turkey B wrote: Most Recent Primary Care Visit:  Provider: [name]  Department: [id number]  Visit Type: SAME DAY  Date: 10/26/2023  Medication: omeprazole (PRILOSEC) 40 MG capsule  Has the patient contacted their pharmacy? yes (Agent: If yes, when and what did the pharmacy advise?)contact pcp  Is this the correct pharmacy for this prescription? yes  This is the patient's preferred pharmacy:  [facility] [address]), [place] - [address]) [place] [address] Phone: [phone] Fax: [phone]   Has the prescription been filled recently? no  Is the patient out of the medication? No , has 7 left  Has the patient been seen for an appointment in the last year OR does the patient have an upcoming appointment? yes  Can we respond through MyChart? yes  Agent: Please be advised that Rx refills may take up to 3 business days. We ask that you follow-up with your pharmacy.

## 2024-01-09 ENCOUNTER — Other Ambulatory Visit: Payer: 59

## 2024-01-09 DIAGNOSIS — Z0000 Encounter for general adult medical examination without abnormal findings: Secondary | ICD-10-CM

## 2024-01-09 DIAGNOSIS — R7989 Other specified abnormal findings of blood chemistry: Secondary | ICD-10-CM | POA: Diagnosis not present

## 2024-01-09 DIAGNOSIS — E7800 Pure hypercholesterolemia, unspecified: Secondary | ICD-10-CM | POA: Diagnosis not present

## 2024-01-09 DIAGNOSIS — E039 Hypothyroidism, unspecified: Secondary | ICD-10-CM | POA: Diagnosis not present

## 2024-01-09 DIAGNOSIS — R7303 Prediabetes: Secondary | ICD-10-CM | POA: Diagnosis not present

## 2024-01-10 LAB — CBC WITH DIFFERENTIAL/PLATELET
Absolute Lymphocytes: 3229 {cells}/uL (ref 850–3900)
Absolute Monocytes: 621 {cells}/uL (ref 200–950)
Basophils Absolute: 28 {cells}/uL (ref 0–200)
Basophils Relative: 0.4 %
Eosinophils Absolute: 83 {cells}/uL (ref 15–500)
Eosinophils Relative: 1.2 %
HCT: 40.4 % (ref 35.0–45.0)
Hemoglobin: 13.8 g/dL (ref 11.7–15.5)
MCH: 30.5 pg (ref 27.0–33.0)
MCHC: 34.2 g/dL (ref 32.0–36.0)
MCV: 89.2 fL (ref 80.0–100.0)
MPV: 9.8 fL (ref 7.5–12.5)
Monocytes Relative: 9 %
Neutro Abs: 2939 {cells}/uL (ref 1500–7800)
Neutrophils Relative %: 42.6 %
Platelets: 279 10*3/uL (ref 140–400)
RBC: 4.53 10*6/uL (ref 3.80–5.10)
RDW: 11.8 % (ref 11.0–15.0)
Total Lymphocyte: 46.8 %
WBC: 6.9 10*3/uL (ref 3.8–10.8)

## 2024-01-10 LAB — LIPID PANEL
Cholesterol: 186 mg/dL (ref <200)
HDL: 58 mg/dL (ref >=50)
LDL Cholesterol (Calc): 110 mg/dL — ABNORMAL HIGH
Non-HDL Cholesterol (Calc): 128 mg/dL (ref <130)
Total CHOL/HDL Ratio: 3.2 (calc) (ref <5.0)
Triglycerides: 89 mg/dL (ref <150)

## 2024-01-10 LAB — COMPLETE METABOLIC PANEL WITH GFR
AG Ratio: 2.2 (calc) (ref 1.0–2.5)
ALT: 73 U/L — ABNORMAL HIGH (ref 6–29)
AST: 42 U/L — ABNORMAL HIGH (ref 10–35)
Albumin: 4.9 g/dL (ref 3.6–5.1)
Alkaline phosphatase (APISO): 80 U/L (ref 37–153)
BUN: 11 mg/dL (ref 7–25)
CO2: 27 mmol/L (ref 20–32)
Calcium: 9.7 mg/dL (ref 8.6–10.4)
Chloride: 102 mmol/L (ref 98–110)
Creat: 0.65 mg/dL (ref 0.60–1.00)
Globulin: 2.2 g/dL (ref 1.9–3.7)
Glucose, Bld: 110 mg/dL — ABNORMAL HIGH (ref 65–99)
Potassium: 3.9 mmol/L (ref 3.5–5.3)
Sodium: 138 mmol/L (ref 135–146)
Total Bilirubin: 0.6 mg/dL (ref 0.2–1.2)
Total Protein: 7.1 g/dL (ref 6.1–8.1)

## 2024-01-10 LAB — HEMOGLOBIN A1C
Hgb A1c MFr Bld: 6 %{Hb} — ABNORMAL HIGH (ref <5.7)
Mean Plasma Glucose: 126 mg/dL
eAG (mmol/L): 7 mmol/L

## 2024-01-10 LAB — TSH: TSH: 0.82 m[IU]/L (ref 0.40–4.50)

## 2024-01-10 LAB — T4, FREE: Free T4: 1.5 ng/dL (ref 0.8–1.8)

## 2024-01-12 ENCOUNTER — Other Ambulatory Visit: Payer: Self-pay | Admitting: Family Medicine

## 2024-01-12 DIAGNOSIS — M19041 Primary osteoarthritis, right hand: Secondary | ICD-10-CM

## 2024-01-13 ENCOUNTER — Ambulatory Visit (INDEPENDENT_AMBULATORY_CARE_PROVIDER_SITE_OTHER): Payer: 59

## 2024-01-13 DIAGNOSIS — Z0000 Encounter for general adult medical examination without abnormal findings: Secondary | ICD-10-CM | POA: Diagnosis not present

## 2024-01-13 NOTE — Telephone Encounter (Signed)
 The original prescription was discontinued on 10/10/2023 by Karma Lew, RN.   Requested Prescriptions  Pending Prescriptions Disp Refills   naproxen (NAPROSYN) 500 MG tablet [Pharmacy Med Name: Naproxen 500 MG Oral Tablet] 60 tablet 0    Sig: TAKE 1 TABLET BY MOUTH TWICE DAILY WITH A MEAL FOR UP TO 2 WEEKS, THEN TAKE AS NEEDED     There is no refill protocol information for this order

## 2024-01-13 NOTE — Progress Notes (Signed)
 Subjective:   Stacy Schmidt is a 73 y.o. who presents for a Medicare Wellness preventive visit.  Visit Complete: Virtual I connected with  Stacy Schmidt on 01/13/24 by a audio enabled telemedicine application and verified that I am speaking with the correct person using two identifiers.  Patient Location: Home  Provider Location: Office/Clinic  I discussed the limitations of evaluation and management by telemedicine. The patient expressed understanding and agreed to proceed.  Vital Signs: Because this visit was a virtual/telehealth visit, some criteria may be missing or patient reported. Any vitals not documented were not able to be obtained and vitals that have been documented are patient reported.  VideoDeclined- This patient declined Librarian, academic. Therefore the visit was completed with audio only.  Persons Participating in Visit: Patient.  AWV Questionnaire: No: Patient Medicare AWV questionnaire was not completed prior to this visit.  Cardiac Risk Factors include: advanced age (>26men, >57 women);dyslipidemia     Objective:    There were no vitals filed for this visit. There is no height or weight on file to calculate BMI.     01/13/2024    2:03 PM 01/06/2023    1:32 PM 06/06/2020   12:33 AM 06/04/2020    7:00 PM 06/29/2018   10:16 AM  Advanced Directives  Does Patient Have a Medical Advance Directive? No No No No No  Would patient like information on creating a medical advance directive? No - Patient declined No - Patient declined No - Patient declined  No - Patient declined    Current Medications (verified) Outpatient Encounter Medications as of 01/13/2024  Medication Sig   Cholecalciferol (VITAMIN D-3) 125 MCG (5000 UT) TABS Take by mouth.   fish oil-omega-3 fatty acids 1000 MG capsule Take 2 g by mouth daily.   levothyroxine (SYNTHROID) 100 MCG tablet TAKE 1 TABLET BY MOUTH ONCE DAILY BEFORE BREAKFAST   omeprazole  (PRILOSEC) 40 MG capsule Take 1 capsule (40 mg total) by mouth daily.   Diclofenac Sodium (VOLTAREN EX) Apply topically. (Patient not taking: Reported on 01/13/2024)   No facility-administered encounter medications on file as of 01/13/2024.    Allergies (verified) Patient has no known allergies.   History: Past Medical History:  Diagnosis Date   Diverticulitis    GERD (gastroesophageal reflux disease)    Hypothyroidism    History reviewed. No pertinent surgical history. Family History  Problem Relation Age of Onset   Hypertension Mother    Congestive Heart Failure Mother    Hypertension Father    Stroke Father    Cancer Sister    Kidney disease Sister    Kidney cancer Sister 19   Breast cancer Sister    Cancer Brother    Kidney disease Brother    Aneurysm Brother    Lung cancer Brother    Social History   Socioeconomic History   Marital status: Single    Spouse name: Not on file   Number of children: 1   Years of education: Not on file   Highest education level: Some college, no degree  Occupational History   Occupation: retired  Tobacco Use   Smoking status: Former    Current packs/day: 1.00    Average packs/day: 1 pack/day for 50.0 years (50.0 ttl pk-yrs)    Types: Cigarettes   Smokeless tobacco: Never  Vaping Use   Vaping status: Never Used  Substance and Sexual Activity   Alcohol use: Not Currently   Drug use: Not on file  Sexual activity: Not on file  Other Topics Concern   Not on file  Social History Narrative   Not on file   Social Drivers of Health   Financial Resource Strain: Low Risk  (01/13/2024)   Overall Financial Resource Strain (CARDIA)    Difficulty of Paying Living Expenses: Not hard at all  Food Insecurity: No Food Insecurity (01/13/2024)   Hunger Vital Sign    Worried About Running Out of Food in the Last Year: Never true    Ran Out of Food in the Last Year: Never true  Transportation Needs: No Transportation Needs (01/13/2024)    PRAPARE - Administrator, Civil Service (Medical): No    Lack of Transportation (Non-Medical): No  Physical Activity: Insufficiently Active (01/13/2024)   Exercise Vital Sign    Days of Exercise per Week: 7 days    Minutes of Exercise per Session: 20 min  Stress: No Stress Concern Present (01/13/2024)   [employer] of Occupational Health - Occupational Stress Questionnaire    Feeling of Stress : Not at all  Social Connections: Socially Isolated (01/13/2024)   Social Connection and Isolation Panel [NHANES]    Frequency of Communication with Friends and Family: Never    Frequency of Social Gatherings with Friends and Family: Never    Attends Religious Services: More than 4 times per year    Active Member of [employer] or Organizations: No    Attends Engineer, structural: Never    Marital Status: Divorced    Tobacco Counseling Counseling given: Not Answered    Clinical Intake:  Pre-visit preparation completed: Yes  Pain : No/denies pain     BMI - recorded: 27 Nutritional Status: BMI 25 -29 Overweight Nutritional Risks: None Diabetes: No  Lab Results  Component Value Date   HGBA1C 6.0 (H) 01/09/2024   HGBA1C 5.9 (H) 01/03/2023   HGBA1C 5.8 (H) 06/29/2022     How often do you need to have someone help you when you read instructions, pamphlets, or other written materials from your doctor or pharmacy?: 1 - Never  Interpreter Needed?: No  Information entered by :: Kennedy Bucker, LPN   Activities of Daily Living     01/13/2024    2:06 PM 01/09/2024   10:11 AM  In your present state of health, do you have any difficulty performing the following activities:  Hearing? 0 0  Vision? 0 0  Difficulty concentrating or making decisions? 1 1  Walking or climbing stairs? 1 1  Dressing or bathing? 0 0  Doing errands, shopping? 0 0  Preparing Food and eating ? N N  Using the Toilet? N N  In the past six months, have you accidently leaked urine? N N  Do you  have problems with loss of bowel control? N N  Managing your Medications? N N  Managing your Finances? N N  Housekeeping or managing your Housekeeping? N N    Patient Care Team: [name], DO as PCP - General (Family Medicine) [facility], [facility] (Optometry)  Indicate any recent Medical Services you may have received from other than [facility] providers in the past year (date may be approximate).     Assessment:   This is a routine wellness examination for Kadeidra.  Hearing/Vision screen Hearing Screening - Comments:: NO AIDS Vision Screening - Comments:: WEARS READERS-    Goals Addressed             This Visit's Progress    DIET -  INCREASE WATER INTAKE         Depression Screen     01/13/2024    2:02 PM 10/26/2023   11:33 AM 01/06/2023    1:30 PM 01/04/2022    2:29 PM 12/30/2021    3:03 PM 11/18/2021   10:39 AM  PHQ 2/9 Scores  PHQ - 2 Score 0 0 0 0 0 0  PHQ- 9 Score 0 5 0 0 3     Fall Risk     01/13/2024    2:06 PM 01/09/2024   10:11 AM 10/26/2023   11:33 AM 01/06/2023    1:35 PM 01/04/2022    2:32 PM  Fall Risk   Falls in the past year? 1  1 0 0  Number falls in past yr: 0 0 1 0 0  Injury with Fall? 0 0 0 0 0  Risk for fall due to : History of fall(s)   No Fall Risks No Fall Risks  Follow up Falls evaluation completed;Falls prevention discussed   Falls prevention discussed;Falls evaluation completed Falls evaluation completed    MEDICARE RISK AT HOME:  Medicare Risk at Home Any stairs in or around the home?: Yes If so, are there any without handrails?: Yes Home free of loose throw rugs in walkways, pet beds, electrical cords, etc?: No Adequate lighting in your home to reduce risk of falls?: Yes Life alert?: No Use of a cane, walker or w/c?: No Grab bars in the bathroom?: No Shower chair or bench in shower?: No Elevated toilet seat or a handicapped toilet?: No  TIMED UP AND GO:  Was the test performed?  No  Cognitive Function: 6CIT  completed        01/13/2024    2:07 PM 01/06/2023    1:42 PM 01/04/2022    2:27 PM  6CIT Screen  What Year? 0 points 0 points 0 points  What month? 0 points 0 points 0 points  What time? 0 points 0 points 0 points  Count back from 20 0 points 0 points 0 points  Months in reverse 2 points 0 points 0 points  Repeat phrase 0 points 0 points 2 points  Total Score 2 points 0 points 2 points    Immunizations Immunization History  Administered Date(s) Administered   Fluad Quad(high Dose 65+) 07/25/2019, 07/07/2022   Fluad Trivalent(High Dose 65+) 08/08/2018, 07/18/2023   Influenza, High Dose Seasonal PF 08/05/2017, 07/22/2020, 07/27/2021   Influenza-Unspecified 08/08/2018, 08/19/2019   Moderna Sars-Covid-2 Vaccination 12/27/2019, 01/24/2020   Pneumococcal Conjugate-13 06/08/2016   Pneumococcal Polysaccharide-23 08/08/2018   Tdap 08/05/2017   Zoster Recombinant(Shingrix) 12/16/2020, 11/23/2021    Screening Tests Health Maintenance  Topic Date Due   Hepatitis C Screening  Never done   Lung Cancer Screening  06/26/2024   Medicare Annual Wellness (AWV)  01/12/2025   Fecal DNA (Cologuard)  01/24/2026   DTaP/Tdap/Td (2 - Td or Tdap) 08/06/2027   Pneumonia Vaccine 5+ Years old  Completed   INFLUENZA VACCINE  Completed   DEXA SCAN  Completed   Zoster Vaccines- Shingrix  Completed   HPV VACCINES  Aged Out   MAMMOGRAM  Discontinued   COVID-19 Vaccine  Discontinued    Health Maintenance  Health Maintenance Due  Topic Date Due   Hepatitis C Screening  Never done   Health Maintenance Items Addressed: UP TO DATE W/ SHOTS, MAMMOGRAM, COLOGUARD & BONE DENSITY SCAN  Additional Screening:  Vision Screening: Recommended annual ophthalmology exams for early detection of glaucoma and other  disorders of the eye.  Dental Screening: Recommended annual dental exams for proper oral hygiene  Community Resource Referral / Chronic Care Management: CRR required this visit?  No   CCM  required this visit?  No     Plan:     I have personally reviewed and noted the following in the patient's chart:   Medical and social history Use of alcohol, tobacco or illicit drugs  Current medications and supplements including opioid prescriptions. Patient is not currently taking opioid prescriptions. Functional ability and status Nutritional status Physical activity Advanced directives List of other physicians Hospitalizations, surgeries, and ER visits in previous 12 months Vitals Screenings to include cognitive, depression, and falls Referrals and appointments  In addition, I have reviewed and discussed with patient certain preventive protocols, quality metrics, and best practice recommendations. A written personalized care plan for preventive services as well as general preventive health recommendations were provided to patient.     Hal Hope, LPN   1/61/0960   After Visit Summary: (MyChart) Due to this being a telephonic visit, the after visit summary with patients personalized plan was offered to patient via MyChart   Notes: Nothing significant to report at this time.

## 2024-01-13 NOTE — Patient Instructions (Addendum)
 Stacy Schmidt , Thank you for taking time to come for your Medicare Wellness Visit. I appreciate your ongoing commitment to your health goals. Please review the following plan we discussed and let me know if I can assist you in the future.   Referrals/Orders/Follow-Ups/Clinician Recommendations: NONE  This is a list of the screening recommended for you and due dates:  Health Maintenance  Topic Date Due   Hepatitis C Screening  Never done   Screening for Lung Cancer  06/26/2024   Medicare Annual Wellness Visit  01/12/2025   Cologuard (Stool DNA test)  01/24/2026   DTaP/Tdap/Td vaccine (2 - Td or Tdap) 08/06/2027   Pneumonia Vaccine  Completed   Flu Shot  Completed   DEXA scan (bone density measurement)  Completed   Zoster (Shingles) Vaccine  Completed   HPV Vaccine  Aged Out   Mammogram  Discontinued   COVID-19 Vaccine  Discontinued    Advanced directives: (ACP Link)Information on Advanced Care Planning can be found at [facility] [place] Advance Health Care Directives Advance Health Care Directives. [email or web address]   Next Medicare Annual Wellness Visit scheduled for next year: Yes   01/18/25 @ 2:00 PM BY PHONE

## 2024-01-16 ENCOUNTER — Ambulatory Visit (INDEPENDENT_AMBULATORY_CARE_PROVIDER_SITE_OTHER): Payer: 59 | Admitting: Family Medicine

## 2024-01-16 ENCOUNTER — Encounter: Payer: Self-pay | Admitting: Family Medicine

## 2024-01-16 VITALS — BP 138/84 | HR 66 | Ht 65.0 in | Wt 164.0 lb

## 2024-01-16 DIAGNOSIS — E039 Hypothyroidism, unspecified: Secondary | ICD-10-CM | POA: Diagnosis not present

## 2024-01-16 DIAGNOSIS — M170 Bilateral primary osteoarthritis of knee: Secondary | ICD-10-CM

## 2024-01-16 DIAGNOSIS — Z0000 Encounter for general adult medical examination without abnormal findings: Secondary | ICD-10-CM | POA: Diagnosis not present

## 2024-01-16 DIAGNOSIS — M19042 Primary osteoarthritis, left hand: Secondary | ICD-10-CM

## 2024-01-16 DIAGNOSIS — R7303 Prediabetes: Secondary | ICD-10-CM

## 2024-01-16 DIAGNOSIS — E7800 Pure hypercholesterolemia, unspecified: Secondary | ICD-10-CM | POA: Diagnosis not present

## 2024-01-16 DIAGNOSIS — M19041 Primary osteoarthritis, right hand: Secondary | ICD-10-CM

## 2024-01-16 MED ORDER — LEVOTHYROXINE SODIUM 100 MCG PO TABS: 100.0000 ug | ORAL_TABLET | Freq: Every day | ORAL | 3 refills | Status: AC

## 2024-01-16 MED ORDER — NAPROXEN 500 MG PO TABS
500.0000 mg | ORAL_TABLET | Freq: Two times a day (BID) | ORAL | 3 refills | Status: AC
Start: 2024-01-16 — End: ?

## 2024-01-16 NOTE — Patient Instructions (Addendum)
 Thank you for coming to the office today.  Re order Naproxen 500mg  take with meal, use as needed. Prefer to take 1-2 weeks at a time then pause for 1-2 weeks. Caution with overuse of anti inflammatory.  Re order Thyroid medicine.  Labs look good overall.  We reviewed the results today   Leg cramps - Try spoonful of yellow mustard to relieve leg cramps or try daily to prevent the problem  - OTC natural option is Hyland's Leg Cramps (Dissolving tablet) take as needed for muscle cramps   DUE for FASTING BLOOD WORK (no food or drink after midnight before the lab appointment, only water or coffee without cream/sugar on the morning of)  SCHEDULE "Lab Only" visit in the morning at the clinic for lab draw in 1 YEAR  - Make sure Lab Only appointment is at about 1 week before your next appointment, so that results will be available  For Lab Results, once available within 2-3 days of blood draw, you can can log in to MyChart online to view your results and a brief explanation. Also, we can discuss results at next follow-up visit.   Please schedule a Follow-up Appointment to: Return for 1 year fasting lab > 1 week later Annual Physical.  If you have any other questions or concerns, please feel free to call the office or send a message through MyChart. You may also schedule an earlier appointment if necessary.  Additionally, you may be receiving a survey about your experience at our office within a few days to 1 week by e-mail or mail. We value your feedback.  Saralyn Pilar, DO [facility], [place]

## 2024-01-16 NOTE — Progress Notes (Signed)
 Subjective:    Patient ID: Stacy Schmidt, female    DOB: [birth year], 73 y.o.   MRN: [id number]  Stacy Schmidt is a 73 y.o. female presenting on 01/16/2024 for Annual Exam   HPI  Discussed the use of AI scribe software for clinical note transcription with the patient, who gave verbal consent to proceed.  History of Present Illness   Stacy Schmidt is a 73 year old female who presents for an annual physical exam and medication refills.  She typically runs out of her medications before her appointments and manages by skipping doses. She is currently taking naproxen twice daily for pain, thyroid medication, and an antacid. She has stopped taking ibuprofen, aspirin, and other pain medications, as well as fish oil and calcium supplements. She continues to take vitamin D3 and Benio.  Hyperlipidemia Last lab LDL cholesterol 116, previously 148 Her lipid panel shows stability, with cholesterol levels improving from 148 two years ago to 110 currently. She attributes some of this improvement to dietary changes, including reducing sugar intake since February.  She has a history of elevated liver enzymes, which have improved over the past year. Her liver enzyme levels have decreased from 74-78 to 42.  Her A1c is 6.0, indicating prediabetes, and she has been actively reducing sugar in her diet, including using aspartame as a sweetener. She has noticed a positive change in her health since making these dietary adjustments.  She experiences left ankle discomfort upon waking, which she attributes to her sleeping position, and cramps in her leg at night, which she manages with compression socks. She has a history of buttock pain, which she describes as arthritis,' and reports that naproxen has been helpful in managing this pain.  She is due for a pneumonia vaccine update, which she has decided to postpone until her next checkup. She has declined further mammograms and is aware of her upcoming  lung scan in September and bone density scan in two years.       Hypothyroidism Last labs normal range. Taking Levothyroxine 100 mcg daily   GERD Chronic problem, on PPI Omeprazole 40mg  daily.     Health Maintenance: UTD Flu Shot.    Shingrix updated  Prevnar-20 vaccine is due next time.   Declines Mammogram screening, last done 5-10 years. Declines mammogram.   Cologuard 01/25/23 negative. Next due 2027     01/16/2024   11:21 AM 01/13/2024    2:02 PM 10/26/2023   11:33 AM  Depression screen PHQ 2/9  Decreased Interest 0 0 0  Down, Depressed, Hopeless 0 0 0  PHQ - 2 Score 0 0 0  Altered sleeping 0 0 0  Tired, decreased energy 1 0 2  Change in appetite 1 0 3  Feeling bad or failure about yourself  0 0 0  Trouble concentrating 0 0 0  Moving slowly or fidgety/restless 0 0 0  Suicidal thoughts 0 0 0  PHQ-9 Score 2 0 5  Difficult doing work/chores Not difficult at all Not difficult at all        01/16/2024   11:22 AM 10/26/2023   11:34 AM 12/30/2021    3:04 PM  GAD 7 : Generalized Anxiety Score  Nervous, Anxious, on Edge 0 0 0  Control/stop worrying 0 0 0  Worry too much - different things 0 0 0  Trouble relaxing 0 0 0  Restless 0 0 0  Easily annoyed or irritable 0 0 0  Afraid - awful might happen  0 0 0  Total GAD 7 Score 0 0 0  Anxiety Difficulty   Not difficult at all     Past Medical History:  Diagnosis Date   Diverticulitis    GERD (gastroesophageal reflux disease)    Hypothyroidism    History reviewed. No pertinent surgical history. Social History   Socioeconomic History   Marital status: Single    Spouse name: Not on file   Number of children: 1   Years of education: Not on file   Highest education level: Some college, no degree  Occupational History   Occupation: retired  Tobacco Use   Smoking status: Former    Current packs/day: 1.00    Average packs/day: 1 pack/day for 50.0 years (50.0 ttl pk-yrs)    Types: Cigarettes   Smokeless tobacco:  Never  Vaping Use   Vaping status: Never Used  Substance and Sexual Activity   Alcohol use: Not Currently   Drug use: Not on file   Sexual activity: Not on file  Other Topics Concern   Not on file  Social History Narrative   Not on file   Social Drivers of Health   Financial Resource Strain: Low Risk  (01/13/2024)   Overall Financial Resource Strain (CARDIA)    Difficulty of Paying Living Expenses: Not hard at all  Food Insecurity: No Food Insecurity (01/13/2024)   Hunger Vital Sign    Worried About Running Out of Food in the Last Year: Never true    Ran Out of Food in the Last Year: Never true  Transportation Needs: No Transportation Needs (01/13/2024)   PRAPARE - Administrator, Civil Service (Medical): No    Lack of Transportation (Non-Medical): No  Physical Activity: Insufficiently Active (01/13/2024)   Exercise Vital Sign    Days of Exercise per Week: 7 days    Minutes of Exercise per Session: 20 min  Stress: No Stress Concern Present (01/13/2024)   [employer] of Occupational Health - Occupational Stress Questionnaire    Feeling of Stress : Not at all  Social Connections: Socially Isolated (01/13/2024)   Social Connection and Isolation Panel [NHANES]    Frequency of Communication with Friends and Family: Never    Frequency of Social Gatherings with Friends and Family: Never    Attends Religious Services: More than 4 times per year    Active Member of [employer] or Organizations: No    Attends Banker Meetings: Never    Marital Status: Divorced  Catering manager Violence: Not At Risk (01/13/2024)   Humiliation, Afraid, Rape, and Kick questionnaire    Fear of Current or Ex-Partner: No    Emotionally Abused: No    Physically Abused: No    Sexually Abused: No   Family History  Problem Relation Age of Onset   Hypertension Mother    Congestive Heart Failure Mother    Hypertension Father    Stroke Father    Cancer Sister    Kidney disease Sister     Kidney cancer Sister 79   Breast cancer Sister    Cancer Brother    Kidney disease Brother    Aneurysm Brother    Lung cancer Brother    Current Outpatient Medications on File Prior to Visit  Medication Sig   Cholecalciferol (VITAMIN D-3) 125 MCG (5000 UT) TABS Take by mouth.   fish oil-omega-3 fatty acids 1000 MG capsule Take 2 g by mouth daily.   omeprazole (PRILOSEC) 40 MG capsule Take 1 capsule (40  mg total) by mouth daily.   Diclofenac Sodium (VOLTAREN EX) Apply topically. (Patient not taking: Reported on 01/13/2024)   No current facility-administered medications on file prior to visit.    Review of Systems Per HPI unless specifically indicated above     Objective:    BP 138/84 (BP Location: Left Arm, Cuff Size: Normal)   Pulse 66   Ht 5\' 5"  (1.651 m)   Wt 164 lb (74.4 kg)   SpO2 96%   BMI 27.29 kg/m   Wt Readings from Last 3 Encounters:  01/16/24 164 lb (74.4 kg)  10/26/23 162 lb (73.5 kg)  07/18/23 167 lb (75.8 kg)    Physical Exam Vitals and nursing note reviewed.  Constitutional:      General: She is not in acute distress.    Appearance: She is well-developed. She is not diaphoretic.     Comments: Well-appearing, comfortable, cooperative  HENT:     Head: Normocephalic and atraumatic.  Eyes:     General:        Right eye: No discharge.        Left eye: No discharge.     Conjunctiva/sclera: Conjunctivae normal.     Pupils: Pupils are equal, round, and reactive to light.  Neck:     Thyroid: No thyromegaly.     Vascular: No carotid bruit.  Cardiovascular:     Rate and Rhythm: Normal rate and regular rhythm.     Pulses: Normal pulses.     Heart sounds: Normal heart sounds. No murmur heard. Pulmonary:     Effort: Pulmonary effort is normal. No respiratory distress.     Breath sounds: Normal breath sounds. No wheezing or rales.  Abdominal:     General: Bowel sounds are normal. There is no distension.     Palpations: Abdomen is soft. There is no mass.      Tenderness: There is no abdominal tenderness.  Musculoskeletal:        General: No tenderness. Normal range of motion.     Cervical back: Normal range of motion and neck supple.     Right lower leg: No edema.     Left lower leg: No edema.     Comments: Upper / Lower Extremities: - Normal muscle tone, strength bilateral upper extremities 5/5, lower extremities 5/5  Lymphadenopathy:     Cervical: No cervical adenopathy.  Skin:    General: Skin is warm and dry.     Findings: No erythema or rash.  Neurological:     Mental Status: She is alert and oriented to person, place, and time.     Comments: Distal sensation intact to light touch all extremities  Psychiatric:        Mood and Affect: Mood normal.        Behavior: Behavior normal.        Thought Content: Thought content normal.     Comments: Well groomed, good eye contact, normal speech and thoughts     Results for orders placed or performed in visit on 01/09/24  T4, free   Collection Time: 01/09/24  8:06 AM  Result Value Ref Range   Free T4 1.5 0.8 - 1.8 ng/dL  TSH   Collection Time: 01/09/24  8:06 AM  Result Value Ref Range   TSH 0.82 0.40 - 4.50 mIU/L  Hemoglobin A1c   Collection Time: 01/09/24  8:06 AM  Result Value Ref Range   Hgb A1c MFr Bld 6.0 (H) <5.7 % of total Hgb  Mean Plasma Glucose 126 mg/dL   eAG (mmol/L) 7.0 mmol/L  Lipid panel   Collection Time: 01/09/24  8:06 AM  Result Value Ref Range   Cholesterol 186 <200 mg/dL   HDL 58 > OR = 50 mg/dL   Triglycerides 89 <960 mg/dL   LDL Cholesterol (Calc) 110 (H) mg/dL (calc)   Total CHOL/HDL Ratio 3.2 <5.0 (calc)   Non-HDL Cholesterol (Calc) 128 <130 mg/dL (calc)  CBC with Differential/Platelet   Collection Time: 01/09/24  8:06 AM  Result Value Ref Range   WBC 6.9 3.8 - 10.8 Thousand/uL   RBC 4.53 3.80 - 5.10 Million/uL   Hemoglobin 13.8 11.7 - 15.5 g/dL   HCT 45.4 09.8 - 11.9 %   MCV 89.2 80.0 - 100.0 fL   MCH 30.5 27.0 - 33.0 pg   MCHC 34.2 32.0 -  36.0 g/dL   RDW 14.7 82.9 - 56.2 %   Platelets 279 140 - 400 Thousand/uL   MPV 9.8 7.5 - 12.5 fL   Neutro Abs 2,939 1,500 - 7,800 cells/uL   Absolute Lymphocytes 3,229 850 - 3,900 cells/uL   Absolute Monocytes 621 200 - 950 cells/uL   Eosinophils Absolute 83 15 - 500 cells/uL   Basophils Absolute 28 0 - 200 cells/uL   Neutrophils Relative % 42.6 %   Total Lymphocyte 46.8 %   Monocytes Relative 9.0 %   Eosinophils Relative 1.2 %   Basophils Relative 0.4 %  COMPLETE METABOLIC PANEL WITH GFR   Collection Time: 01/09/24  8:06 AM  Result Value Ref Range   Glucose, Bld 110 (H) 65 - 99 mg/dL   BUN 11 7 - 25 mg/dL   Creat 1.30 8.65 - 7.84 mg/dL   BUN/Creatinine Ratio SEE NOTE: 6 - 22 (calc)   Sodium 138 135 - 146 mmol/L   Potassium 3.9 3.5 - 5.3 mmol/L   Chloride 102 98 - 110 mmol/L   CO2 27 20 - 32 mmol/L   Calcium 9.7 8.6 - 10.4 mg/dL   Total Protein 7.1 6.1 - 8.1 g/dL   Albumin 4.9 3.6 - 5.1 g/dL   Globulin 2.2 1.9 - 3.7 g/dL (calc)   AG Ratio 2.2 1.0 - 2.5 (calc)   Total Bilirubin 0.6 0.2 - 1.2 mg/dL   Alkaline phosphatase (APISO) 80 37 - 153 U/L   AST 42 (H) 10 - 35 U/L   ALT 73 (H) 6 - 29 U/L      Assessment & Plan:   Problem List Items Addressed This Visit     Acquired hypothyroidism   Relevant Medications   levothyroxine (SYNTHROID) 100 MCG tablet   Pre-diabetes   Primary osteoarthritis of both knees   Relevant Medications   naproxen (NAPROSYN) 500 MG tablet   Pure hypercholesterolemia   Other Visit Diagnoses       Annual physical exam    -  Primary     Osteoarthritis of fingers of hands, bilateral       Relevant Medications   naproxen (NAPROSYN) 500 MG tablet        Updated Health Maintenance information Reviewed recent lab results with patient Encouraged improvement to lifestyle with diet and exercise Goal of weight loss   Annual Wellness Visit Annual wellness visit conducted. No acute issues reported. She prefers annual visits unless issues  arise. - Refill medications as needed - Schedule next annual wellness visit in one year  Arthritis Reports improvement in pain with naproxen use, primarily in the tailbone area. Informed about potential long-term  risks of NSAIDs, including renal and cardiovascular effects. Advised to take breaks in usage to avoid long-term harm. - Refill naproxen with instructions to take breaks in usage - Advise intermittent use: take for 1-2 weeks, then take a break for 1-2 weeks  Liver Enzyme Abnormality Liver enzymes previously elevated, likely due to hepatic cholesterol buildup. Current levels improved from 74-78 to 42. No acute intervention required as levels are improving.  Hyperlipidemia Cholesterol levels have stabilized at 110, improved from 148 two years ago. Dietary changes, including reduced sugar intake, may have contributed to improvement.  Prediabetes A1c is at 6.0. Dietary changes, including reduced sugar intake, may help in further lowering A1c levels.  Hypothyroidism Thyroid function is well-managed with current medication dosage. - Continue current thyroid medication dosage  General Health Maintenance Due for pneumonia vaccine update, opted to wait until next checkup. Colon screening up to date, next due in 2027. Shingles and flu vaccines are up to date. Declined further mammograms. Lung CT scan due in September. Bone density scan due every other year. - Administer pneumonia vaccine at next checkup - Schedule lung CT scan for September - Monitor for bone density scan in the following year        No orders of the defined types were placed in this encounter.   Meds ordered this encounter  Medications   levothyroxine (SYNTHROID) 100 MCG tablet    Sig: Take 1 tablet (100 mcg total) by mouth daily before breakfast.    Dispense:  90 tablet    Refill:  3    Add future refills   naproxen (NAPROSYN) 500 MG tablet    Sig: Take 1 tablet (500 mg total) by mouth 2 (two) times daily  with a meal.    Dispense:  60 tablet    Refill:  3     Follow up plan: Return for 1 year fasting lab > 1 week later Annual Physical.   Saralyn Pilar, DO [facility] 01/16/2024, 10:39 AM

## 2024-03-23 ENCOUNTER — Other Ambulatory Visit: Payer: Self-pay

## 2024-05-17 DIAGNOSIS — H40003 Preglaucoma, unspecified, bilateral: Secondary | ICD-10-CM | POA: Diagnosis not present

## 2024-05-17 DIAGNOSIS — H43813 Vitreous degeneration, bilateral: Secondary | ICD-10-CM | POA: Diagnosis not present

## 2024-05-17 DIAGNOSIS — H2513 Age-related nuclear cataract, bilateral: Secondary | ICD-10-CM | POA: Diagnosis not present

## 2024-06-27 ENCOUNTER — Ambulatory Visit
Admission: RE | Admit: 2024-06-27 | Discharge: 2024-06-27 | Disposition: A | Discharge status: Home / Self Care | Source: Ambulatory Visit | Attending: Family Medicine | Admitting: Family Medicine

## 2024-06-27 DIAGNOSIS — Z122 Encounter for screening for malignant neoplasm of respiratory organs: Secondary | ICD-10-CM | POA: Diagnosis not present

## 2024-06-27 DIAGNOSIS — Z87891 Personal history of nicotine dependence: Secondary | ICD-10-CM | POA: Insufficient documentation

## 2024-07-03 ENCOUNTER — Ambulatory Visit (INDEPENDENT_AMBULATORY_CARE_PROVIDER_SITE_OTHER)

## 2024-07-03 DIAGNOSIS — Z23 Encounter for immunization: Secondary | ICD-10-CM | POA: Diagnosis not present

## 2024-07-09 ENCOUNTER — Other Ambulatory Visit: Payer: Self-pay

## 2024-07-09 DIAGNOSIS — Z122 Encounter for screening for malignant neoplasm of respiratory organs: Secondary | ICD-10-CM

## 2024-07-09 DIAGNOSIS — Z87891 Personal history of nicotine dependence: Secondary | ICD-10-CM

## 2024-07-31 NOTE — Progress Notes (Signed)
 Stacy Schmidt                                          MRN: [id number]   07/31/2024   The [facility] Quality Team Specialist reviewed this patient medical record for the purposes of chart review for care gap closure. The following were reviewed: chart review for care gap closure-breast cancer screening.    [facility] Quality Team

## 2025-01-15 ENCOUNTER — Other Ambulatory Visit

## 2025-01-18 ENCOUNTER — Encounter

## 2025-01-22 ENCOUNTER — Encounter: Admitting: Family Medicine

## 2025-01-23 ENCOUNTER — Encounter
